# Patient Record
Sex: Female | Born: 1955 | Race: White | Hispanic: No | Marital: Married | Smoking: Former smoker
Health system: Southern US, Community
[De-identification: ages and names within clinical notes are randomized; demographics above are authoritative.]

## PROBLEM LIST (undated history)

## (undated) DIAGNOSIS — Z789 Other specified health status: Secondary | ICD-10-CM

## (undated) HISTORY — PX: BREAST SURGERY: SHX581

## (undated) HISTORY — PX: APPENDECTOMY: SHX54

## (undated) HISTORY — PX: TUBAL LIGATION: SHX77

## (undated) HISTORY — PX: DILATION AND CURETTAGE OF UTERUS: SHX78

---

## 2000-02-24 ENCOUNTER — Other Ambulatory Visit: Admission: RE | Admit: 2000-02-24 | Discharge: 2000-02-24 | Payer: Self-pay | Admitting: Obstetrics & Gynecology

## 2000-09-18 ENCOUNTER — Emergency Department (HOSPITAL_COMMUNITY): Admission: EM | Admit: 2000-09-18 | Discharge: 2000-09-18 | Payer: Self-pay | Admitting: Emergency Medicine

## 2001-09-07 ENCOUNTER — Other Ambulatory Visit: Admission: RE | Admit: 2001-09-07 | Discharge: 2001-09-07 | Payer: Self-pay | Admitting: Obstetrics & Gynecology

## 2002-11-27 ENCOUNTER — Other Ambulatory Visit: Admission: RE | Admit: 2002-11-27 | Discharge: 2002-11-27 | Payer: Self-pay | Admitting: Obstetrics & Gynecology

## 2003-04-05 ENCOUNTER — Other Ambulatory Visit: Admission: RE | Admit: 2003-04-05 | Discharge: 2003-04-05 | Payer: Self-pay | Admitting: Obstetrics & Gynecology

## 2004-05-19 ENCOUNTER — Other Ambulatory Visit: Admission: RE | Admit: 2004-05-19 | Discharge: 2004-05-19 | Payer: Self-pay | Admitting: Obstetrics & Gynecology

## 2004-08-11 ENCOUNTER — Other Ambulatory Visit: Admission: RE | Admit: 2004-08-11 | Discharge: 2004-08-11 | Payer: Self-pay | Admitting: Obstetrics & Gynecology

## 2005-03-04 ENCOUNTER — Other Ambulatory Visit: Admission: RE | Admit: 2005-03-04 | Discharge: 2005-03-04 | Payer: Self-pay | Admitting: Obstetrics & Gynecology

## 2013-01-11 ENCOUNTER — Encounter (INDEPENDENT_AMBULATORY_CARE_PROVIDER_SITE_OTHER): Payer: BC Managed Care – PPO | Admitting: Ophthalmology

## 2013-01-11 DIAGNOSIS — H2510 Age-related nuclear cataract, unspecified eye: Secondary | ICD-10-CM

## 2013-01-11 DIAGNOSIS — H43819 Vitreous degeneration, unspecified eye: Secondary | ICD-10-CM

## 2013-01-11 DIAGNOSIS — H35379 Puckering of macula, unspecified eye: Secondary | ICD-10-CM

## 2013-01-11 DIAGNOSIS — H3530 Unspecified macular degeneration: Secondary | ICD-10-CM

## 2013-01-15 ENCOUNTER — Encounter (INDEPENDENT_AMBULATORY_CARE_PROVIDER_SITE_OTHER): Payer: Self-pay | Admitting: Ophthalmology

## 2013-03-27 ENCOUNTER — Encounter (HOSPITAL_COMMUNITY): Payer: Self-pay | Admitting: Pharmacy Technician

## 2013-04-03 ENCOUNTER — Encounter (INDEPENDENT_AMBULATORY_CARE_PROVIDER_SITE_OTHER): Payer: BC Managed Care – PPO | Admitting: Ophthalmology

## 2013-04-06 ENCOUNTER — Encounter (HOSPITAL_COMMUNITY): Payer: Self-pay | Admitting: *Deleted

## 2013-04-10 ENCOUNTER — Encounter (HOSPITAL_COMMUNITY): Payer: BC Managed Care – PPO | Admitting: Certified Registered Nurse Anesthetist

## 2013-04-10 ENCOUNTER — Ambulatory Visit (HOSPITAL_COMMUNITY)
Admission: RE | Admit: 2013-04-10 | Discharge: 2013-04-11 | Disposition: A | Discharge status: Home / Self Care | Payer: BC Managed Care – PPO | Source: Ambulatory Visit | Attending: Ophthalmology | Admitting: Ophthalmology

## 2013-04-10 ENCOUNTER — Encounter (HOSPITAL_COMMUNITY)
Admission: RE | Disposition: A | Discharge status: Home / Self Care | Payer: Self-pay | Source: Ambulatory Visit | Attending: Ophthalmology

## 2013-04-10 ENCOUNTER — Ambulatory Visit (HOSPITAL_COMMUNITY): Payer: BC Managed Care – PPO

## 2013-04-10 ENCOUNTER — Ambulatory Visit (HOSPITAL_COMMUNITY): Payer: BC Managed Care – PPO | Admitting: Certified Registered Nurse Anesthetist

## 2013-04-10 ENCOUNTER — Encounter (INDEPENDENT_AMBULATORY_CARE_PROVIDER_SITE_OTHER): Payer: BC Managed Care – PPO | Admitting: Ophthalmology

## 2013-04-10 ENCOUNTER — Encounter (HOSPITAL_COMMUNITY): Payer: Self-pay | Admitting: *Deleted

## 2013-04-10 DIAGNOSIS — H2510 Age-related nuclear cataract, unspecified eye: Secondary | ICD-10-CM

## 2013-04-10 DIAGNOSIS — H35379 Puckering of macula, unspecified eye: Secondary | ICD-10-CM

## 2013-04-10 DIAGNOSIS — H43819 Vitreous degeneration, unspecified eye: Secondary | ICD-10-CM

## 2013-04-10 DIAGNOSIS — H35371 Puckering of macula, right eye: Secondary | ICD-10-CM | POA: Diagnosis present

## 2013-04-10 DIAGNOSIS — H3530 Unspecified macular degeneration: Secondary | ICD-10-CM

## 2013-04-10 DIAGNOSIS — Z87891 Personal history of nicotine dependence: Secondary | ICD-10-CM | POA: Insufficient documentation

## 2013-04-10 DIAGNOSIS — D381 Neoplasm of uncertain behavior of trachea, bronchus and lung: Secondary | ICD-10-CM

## 2013-04-10 HISTORY — PX: PARS PLANA VITRECTOMY: SHX2166

## 2013-04-10 HISTORY — DX: Other specified health status: Z78.9

## 2013-04-10 HISTORY — PX: MEMBRANE PEEL: SHX5967

## 2013-04-10 HISTORY — PX: LASER PHOTO ABLATION: SHX5942

## 2013-04-10 LAB — COMPREHENSIVE METABOLIC PANEL
ALT: 20 U/L (ref 0–35)
AST: 22 U/L (ref 0–37)
Albumin: 4.1 g/dL (ref 3.5–5.2)
Alkaline Phosphatase: 70 U/L (ref 39–117)
BILIRUBIN TOTAL: 0.6 mg/dL (ref 0.3–1.2)
BUN: 12 mg/dL (ref 6–23)
CALCIUM: 9.4 mg/dL (ref 8.4–10.5)
CO2: 25 mEq/L (ref 19–32)
Chloride: 102 mEq/L (ref 96–112)
Creatinine, Ser: 0.89 mg/dL (ref 0.50–1.10)
GFR calc Af Amer: 81 mL/min — ABNORMAL LOW (ref >90)
GFR calc non Af Amer: 70 mL/min — ABNORMAL LOW (ref >90)
Glucose, Bld: 104 mg/dL — ABNORMAL HIGH (ref 70–99)
Potassium: 4 mEq/L (ref 3.7–5.3)
Sodium: 142 mEq/L (ref 137–147)
Total Protein: 7.1 g/dL (ref 6.0–8.3)

## 2013-04-10 LAB — CBC
HEMATOCRIT: 43.3 % (ref 36.0–46.0)
Hemoglobin: 15 g/dL (ref 12.0–15.0)
MCH: 32.6 pg (ref 26.0–34.0)
MCHC: 34.6 g/dL (ref 30.0–36.0)
MCV: 94.1 fL (ref 78.0–100.0)
Platelets: 238 10*3/uL (ref 150–400)
RBC: 4.6 MIL/uL (ref 3.87–5.11)
RDW: 12.3 % (ref 11.5–15.5)
WBC: 8.3 10*3/uL (ref 4.0–10.5)

## 2013-04-10 LAB — PROTIME-INR
INR: 1.02 (ref 0.00–1.49)
PROTHROMBIN TIME: 13.2 s (ref 11.6–15.2)

## 2013-04-10 SURGERY — PARS PLANA VITRECTOMY WITH 25 GAUGE
Anesthesia: General | Site: Eye | Laterality: Right

## 2013-04-10 MED ORDER — EPINEPHRINE HCL 1 MG/ML IJ SOLN
INTRAMUSCULAR | Status: AC
  Filled 2013-04-10: qty 1

## 2013-04-10 MED ORDER — CYCLOPENTOLATE HCL 1 % OP SOLN
1.0000 [drp] | OPHTHALMIC | Status: AC | PRN
  Administered 2013-04-10 (×3): 1 [drp] via OPHTHALMIC
  Filled 2013-04-10: qty 2

## 2013-04-10 MED ORDER — DEXAMETHASONE SODIUM PHOSPHATE 10 MG/ML IJ SOLN
INTRAMUSCULAR | Status: DC | PRN
  Administered 2013-04-10: 10 mg

## 2013-04-10 MED ORDER — PROPOFOL 10 MG/ML IV BOLUS
INTRAVENOUS | Status: AC
  Filled 2013-04-10: qty 20

## 2013-04-10 MED ORDER — DEXAMETHASONE SODIUM PHOSPHATE 10 MG/ML IJ SOLN
INTRAMUSCULAR | Status: DC | PRN
  Administered 2013-04-10: 8 mg via INTRAVENOUS

## 2013-04-10 MED ORDER — GLYCOPYRROLATE 0.2 MG/ML IJ SOLN
INTRAMUSCULAR | Status: DC | PRN
  Administered 2013-04-10: .8 mg via INTRAVENOUS

## 2013-04-10 MED ORDER — HYDROCODONE-ACETAMINOPHEN 5-325 MG PO TABS
1.0000 | ORAL_TABLET | ORAL | Status: DC | PRN
Start: 2013-04-10 — End: 2013-04-11
  Administered 2013-04-10 – 2013-04-11 (×2): 2 via ORAL
  Filled 2013-04-10 (×2): qty 2

## 2013-04-10 MED ORDER — PHENYLEPHRINE HCL 10 MG/ML IJ SOLN
INTRAMUSCULAR | Status: DC | PRN
  Administered 2013-04-10: 80 ug via INTRAVENOUS
  Administered 2013-04-10: 40 ug via INTRAVENOUS
  Administered 2013-04-10: 80 ug via INTRAVENOUS
  Administered 2013-04-10: 120 ug via INTRAVENOUS

## 2013-04-10 MED ORDER — SODIUM HYALURONATE 10 MG/ML IO SOLN
INTRAOCULAR | Status: DC | PRN
  Administered 2013-04-10: 0.85 mL via INTRAOCULAR

## 2013-04-10 MED ORDER — MIDAZOLAM HCL 5 MG/5ML IJ SOLN
INTRAMUSCULAR | Status: DC | PRN
Start: 2013-04-10 — End: 2013-04-10
  Administered 2013-04-10: 1 mg via INTRAVENOUS

## 2013-04-10 MED ORDER — TRIAMCINOLONE ACETONIDE 40 MG/ML IJ SUSP
INTRAMUSCULAR | Status: AC
  Filled 2013-04-10: qty 5

## 2013-04-10 MED ORDER — BRIMONIDINE TARTRATE 0.2 % OP SOLN
1.0000 [drp] | Freq: Two times a day (BID) | OPHTHALMIC | Status: DC
  Filled 2013-04-10: qty 5

## 2013-04-10 MED ORDER — SODIUM CHLORIDE 0.9 % IV SOLN
INTRAVENOUS | Status: DC | PRN
  Administered 2013-04-10: 12:00:00 via INTRAVENOUS

## 2013-04-10 MED ORDER — LIDOCAINE HCL (CARDIAC) 20 MG/ML IV SOLN
INTRAVENOUS | Status: AC
  Filled 2013-04-10: qty 5

## 2013-04-10 MED ORDER — GATIFLOXACIN 0.5 % OP SOLN
1.0000 [drp] | OPHTHALMIC | Status: AC | PRN
  Administered 2013-04-10 (×3): 1 [drp] via OPHTHALMIC
  Filled 2013-04-10: qty 2.5

## 2013-04-10 MED ORDER — ATROPINE SULFATE 1 % OP SOLN
OPHTHALMIC | Status: AC
  Filled 2013-04-10: qty 2

## 2013-04-10 MED ORDER — PROMETHAZINE HCL 25 MG/ML IJ SOLN: 6.2500 mg | INTRAMUSCULAR | Status: DC | PRN

## 2013-04-10 MED ORDER — PREDNISOLONE ACETATE 1 % OP SUSP
1.0000 [drp] | Freq: Four times a day (QID) | OPHTHALMIC | Status: DC
  Filled 2013-04-10: qty 1

## 2013-04-10 MED ORDER — BACITRACIN-POLYMYXIN B 500-10000 UNIT/GM OP OINT
TOPICAL_OINTMENT | OPHTHALMIC | Status: DC | PRN
  Administered 2013-04-10: 1 via OPHTHALMIC

## 2013-04-10 MED ORDER — GATIFLOXACIN 0.5 % OP SOLN
1.0000 [drp] | Freq: Four times a day (QID) | OPHTHALMIC | Status: DC
  Filled 2013-04-10: qty 2.5

## 2013-04-10 MED ORDER — SODIUM CHLORIDE 0.45 % IV SOLN
INTRAVENOUS | Status: DC
  Administered 2013-04-10: 17:00:00 via INTRAVENOUS

## 2013-04-10 MED ORDER — BSS IO SOLN
INTRAOCULAR | Status: DC | PRN
  Administered 2013-04-10: 15 mL via INTRAOCULAR

## 2013-04-10 MED ORDER — HYDROMORPHONE HCL PF 1 MG/ML IJ SOLN
0.2500 mg | INTRAMUSCULAR | Status: DC | PRN
  Administered 2013-04-10 (×2): 0.5 mg via INTRAVENOUS

## 2013-04-10 MED ORDER — NEOSTIGMINE METHYLSULFATE 1 MG/ML IJ SOLN
INTRAMUSCULAR | Status: DC | PRN
  Administered 2013-04-10: 4 mg via INTRAVENOUS

## 2013-04-10 MED ORDER — ACETAZOLAMIDE SODIUM 500 MG IJ SOLR
500.0000 mg | Freq: Once | INTRAMUSCULAR | Status: AC
  Administered 2013-04-11: 500 mg via INTRAVENOUS
  Filled 2013-04-10: qty 500

## 2013-04-10 MED ORDER — LIDOCAINE HCL (CARDIAC) 20 MG/ML IV SOLN
INTRAVENOUS | Status: DC | PRN
  Administered 2013-04-10: 80 mg via INTRAVENOUS

## 2013-04-10 MED ORDER — ONDANSETRON HCL 4 MG/2ML IJ SOLN
4.0000 mg | Freq: Four times a day (QID) | INTRAMUSCULAR | Status: DC | PRN
Start: 2013-04-10 — End: 2013-04-11

## 2013-04-10 MED ORDER — MIDAZOLAM HCL 2 MG/2ML IJ SOLN
INTRAMUSCULAR | Status: AC
  Filled 2013-04-10: qty 2

## 2013-04-10 MED ORDER — BACITRACIN-POLYMYXIN B 500-10000 UNIT/GM OP OINT
TOPICAL_OINTMENT | OPHTHALMIC | Status: AC
  Filled 2013-04-10: qty 3.5

## 2013-04-10 MED ORDER — PROPOFOL 10 MG/ML IV BOLUS
INTRAVENOUS | Status: DC | PRN
  Administered 2013-04-10: 150 mg via INTRAVENOUS

## 2013-04-10 MED ORDER — GLYCOPYRROLATE 0.2 MG/ML IJ SOLN
INTRAMUSCULAR | Status: AC
  Filled 2013-04-10: qty 4

## 2013-04-10 MED ORDER — SODIUM HYALURONATE 10 MG/ML IO SOLN
INTRAOCULAR | Status: AC
  Filled 2013-04-10: qty 0.85

## 2013-04-10 MED ORDER — POLYMYXIN B SULFATE 500000 UNITS IJ SOLR
INTRAMUSCULAR | Status: AC
  Filled 2013-04-10: qty 1

## 2013-04-10 MED ORDER — ROCURONIUM BROMIDE 50 MG/5ML IV SOLN
INTRAVENOUS | Status: AC
  Filled 2013-04-10: qty 1

## 2013-04-10 MED ORDER — BUPIVACAINE HCL (PF) 0.75 % IJ SOLN
INTRAMUSCULAR | Status: AC
  Filled 2013-04-10: qty 10

## 2013-04-10 MED ORDER — FENTANYL CITRATE 0.05 MG/ML IJ SOLN
INTRAMUSCULAR | Status: DC | PRN
  Administered 2013-04-10: 75 ug via INTRAVENOUS

## 2013-04-10 MED ORDER — ACETAMINOPHEN 325 MG PO TABS: 325.0000 mg | ORAL_TABLET | ORAL | Status: DC | PRN

## 2013-04-10 MED ORDER — TETRACAINE HCL 0.5 % OP SOLN
2.0000 [drp] | Freq: Once | OPHTHALMIC | Status: DC
  Filled 2013-04-10: qty 2

## 2013-04-10 MED ORDER — HYDROMORPHONE HCL PF 1 MG/ML IJ SOLN
INTRAMUSCULAR | Status: AC
  Filled 2013-04-10: qty 1

## 2013-04-10 MED ORDER — DEXAMETHASONE SODIUM PHOSPHATE 10 MG/ML IJ SOLN
INTRAMUSCULAR | Status: AC
  Filled 2013-04-10: qty 1

## 2013-04-10 MED ORDER — ONDANSETRON HCL 4 MG/2ML IJ SOLN
INTRAMUSCULAR | Status: DC | PRN
  Administered 2013-04-10: 4 mg via INTRAVENOUS

## 2013-04-10 MED ORDER — BSS PLUS IO SOLN
INTRAOCULAR | Status: AC
  Filled 2013-04-10: qty 500

## 2013-04-10 MED ORDER — ONDANSETRON HCL 4 MG/2ML IJ SOLN
INTRAMUSCULAR | Status: AC
  Filled 2013-04-10: qty 2

## 2013-04-10 MED ORDER — 0.9 % SODIUM CHLORIDE (POUR BTL) OPTIME
TOPICAL | Status: DC | PRN
  Administered 2013-04-10: 1000 mL

## 2013-04-10 MED ORDER — BACITRACIN-POLYMYXIN B 500-10000 UNIT/GM OP OINT
1.0000 "application " | TOPICAL_OINTMENT | Freq: Four times a day (QID) | OPHTHALMIC | Status: DC
  Filled 2013-04-10: qty 3.5

## 2013-04-10 MED ORDER — LATANOPROST 0.005 % OP SOLN
1.0000 [drp] | Freq: Every day | OPHTHALMIC | Status: DC
  Filled 2013-04-10: qty 2.5

## 2013-04-10 MED ORDER — PHENYLEPHRINE HCL 2.5 % OP SOLN
1.0000 [drp] | OPHTHALMIC | Status: AC | PRN
  Administered 2013-04-10 (×3): 1 [drp] via OPHTHALMIC
  Filled 2013-04-10: qty 15

## 2013-04-10 MED ORDER — OXYCODONE HCL 5 MG/5ML PO SOLN: 5.0000 mg | Freq: Once | ORAL | Status: DC | PRN

## 2013-04-10 MED ORDER — TEMAZEPAM 15 MG PO CAPS
15.0000 mg | ORAL_CAPSULE | Freq: Every evening | ORAL | Status: DC | PRN
  Administered 2013-04-10: 15 mg via ORAL
  Filled 2013-04-10: qty 1

## 2013-04-10 MED ORDER — BUPIVACAINE HCL (PF) 0.75 % IJ SOLN
INTRAMUSCULAR | Status: DC | PRN
  Administered 2013-04-10: 10 mL

## 2013-04-10 MED ORDER — GENTAMICIN SULFATE 40 MG/ML IJ SOLN
INTRAMUSCULAR | Status: AC
  Filled 2013-04-10: qty 2

## 2013-04-10 MED ORDER — ROCURONIUM BROMIDE 100 MG/10ML IV SOLN
INTRAVENOUS | Status: DC | PRN
  Administered 2013-04-10: 30 mg via INTRAVENOUS

## 2013-04-10 MED ORDER — SODIUM CHLORIDE 0.9 % IJ SOLN
INTRAMUSCULAR | Status: DC | PRN
  Administered 2013-04-10: 11:00:00

## 2013-04-10 MED ORDER — MORPHINE SULFATE 2 MG/ML IJ SOLN
1.0000 mg | INTRAMUSCULAR | Status: AC | PRN
  Administered 2013-04-10 (×2): 2 mg via INTRAVENOUS
  Filled 2013-04-10: qty 2

## 2013-04-10 MED ORDER — TROPICAMIDE 1 % OP SOLN
1.0000 [drp] | OPHTHALMIC | Status: AC | PRN
  Administered 2013-04-10 (×3): 1 [drp] via OPHTHALMIC
  Filled 2013-04-10: qty 3

## 2013-04-10 MED ORDER — NEOSTIGMINE METHYLSULFATE 1 MG/ML IJ SOLN
INTRAMUSCULAR | Status: AC
  Filled 2013-04-10: qty 10

## 2013-04-10 MED ORDER — OXYCODONE HCL 5 MG PO TABS: 5.0000 mg | ORAL_TABLET | Freq: Once | ORAL | Status: DC | PRN

## 2013-04-10 MED ORDER — DOCUSATE SODIUM 100 MG PO CAPS
100.0000 mg | ORAL_CAPSULE | Freq: Two times a day (BID) | ORAL | Status: DC
  Administered 2013-04-10: 100 mg via ORAL

## 2013-04-10 MED ORDER — EPINEPHRINE HCL 1 MG/ML IJ SOLN
INTRAMUSCULAR | Status: DC | PRN
  Administered 2013-04-10: 11:00:00

## 2013-04-10 MED ORDER — FENTANYL CITRATE 0.05 MG/ML IJ SOLN
INTRAMUSCULAR | Status: AC
  Filled 2013-04-10: qty 5

## 2013-04-10 MED ORDER — SODIUM CHLORIDE 0.9 % IJ SOLN
INTRAMUSCULAR | Status: AC
  Filled 2013-04-10: qty 10

## 2013-04-10 MED ORDER — MAGNESIUM HYDROXIDE 400 MG/5ML PO SUSP: 15.0000 mL | Freq: Four times a day (QID) | ORAL | Status: DC | PRN

## 2013-04-10 SURGICAL SUPPLY — 59 items
BALL CTTN LRG ABS STRL LF (GAUZE/BANDAGES/DRESSINGS) ×3
BLADE MVR KNIFE 20G (BLADE) ×2 IMPLANT
CANNULA VLV SOFT TIP 25G (OPHTHALMIC) ×1 IMPLANT
CANNULA VLV SOFT TIP 25GA (OPHTHALMIC) ×3 IMPLANT
CORDS BIPOLAR (ELECTRODE) ×2 IMPLANT
COTTONBALL LRG STERILE PKG (GAUZE/BANDAGES/DRESSINGS) ×9 IMPLANT
COVER MAYO STAND STRL (DRAPES) ×3 IMPLANT
DRAPE INCISE 51X51 W/FILM STRL (DRAPES) ×3 IMPLANT
DRAPE OPHTHALMIC 77X100 STRL (CUSTOM PROCEDURE TRAY) ×5 IMPLANT
FORCEPS GRIESHABER ILM 25G A (INSTRUMENTS) ×2 IMPLANT
GLOVE SS BIOGEL STRL SZ 6.5 (GLOVE) ×1 IMPLANT
GLOVE SS BIOGEL STRL SZ 7 (GLOVE) ×1 IMPLANT
GLOVE SUPERSENSE BIOGEL SZ 6.5 (GLOVE) ×2
GLOVE SUPERSENSE BIOGEL SZ 7 (GLOVE) ×2
GLOVE SURG 8.5 LATEX PF (GLOVE) ×3 IMPLANT
GLOVE SURG SS PI 7.0 STRL IVOR (GLOVE) ×2 IMPLANT
GOWN STRL REUS W/ TWL LRG LVL3 (GOWN DISPOSABLE) ×3 IMPLANT
GOWN STRL REUS W/TWL LRG LVL3 (GOWN DISPOSABLE) ×9
HANDLE PNEUMATIC FOR CONSTEL (OPHTHALMIC) ×2 IMPLANT
KIT BASIN OR (CUSTOM PROCEDURE TRAY) ×3 IMPLANT
KNIFE CRESCENT 2.5 55 ANG (BLADE) ×2 IMPLANT
KNIFE GRIESHABER SHARP 2.5MM (MISCELLANEOUS) ×2 IMPLANT
MASK EYE SHIELD (GAUZE/BANDAGES/DRESSINGS) ×2 IMPLANT
MICROPICK .5MM ×2 IMPLANT
MICROPICK 25G (MISCELLANEOUS) ×2
NDL 18GX1X1/2 (RX/OR ONLY) (NEEDLE) ×1 IMPLANT
NDL 25GX 5/8IN NON SAFETY (NEEDLE) ×1 IMPLANT
NDL FILTER BLUNT 18X1 1/2 (NEEDLE) ×1 IMPLANT
NDL HYPO 30X.5 LL (NEEDLE) ×1 IMPLANT
NEEDLE 18GX1X1/2 (RX/OR ONLY) (NEEDLE) ×3 IMPLANT
NEEDLE 25GX 5/8IN NON SAFETY (NEEDLE) ×3 IMPLANT
NEEDLE FILTER BLUNT 18X 1/2SAF (NEEDLE) ×2
NEEDLE FILTER BLUNT 18X1 1/2 (NEEDLE) ×1 IMPLANT
NEEDLE HYPO 30X.5 LL (NEEDLE) ×3 IMPLANT
NS IRRIG 1000ML POUR BTL (IV SOLUTION) ×3 IMPLANT
PACK VITRECTOMY CUSTOM (CUSTOM PROCEDURE TRAY) ×3 IMPLANT
PAD ARMBOARD 7.5X6 YLW CONV (MISCELLANEOUS) ×6 IMPLANT
PAD EYE OVAL STERILE LF (GAUZE/BANDAGES/DRESSINGS) ×2 IMPLANT
PAK PIK VITRECTOMY CVS 25GA (OPHTHALMIC) ×3 IMPLANT
PIC ILLUMINATED 25G (OPHTHALMIC) ×3
PICK MICROPICK 25G (MISCELLANEOUS) IMPLANT
PIK ILLUMINATED 25G (OPHTHALMIC) ×1 IMPLANT
PROBE LASER ILLUM FLEX CVD 25G (OPHTHALMIC) IMPLANT
REPL STRA BRUSH NDL (NEEDLE) IMPLANT
REPL STRA BRUSH NEEDLE (NEEDLE) ×3 IMPLANT
RESERVOIR BACK FLUSH (MISCELLANEOUS) ×2 IMPLANT
ROLLS DENTAL (MISCELLANEOUS) ×6 IMPLANT
SCISSORS TIP ADVANCED DSP 25GA (INSTRUMENTS) IMPLANT
SCRAPER DIAMOND DUST MEMBRANE (MISCELLANEOUS) ×2 IMPLANT
SPONGE SURGIFOAM ABS GEL 12-7 (HEMOSTASIS) ×3 IMPLANT
SYR 20CC LL (SYRINGE) ×3 IMPLANT
SYR 5ML LL (SYRINGE) IMPLANT
SYR BULB 3OZ (MISCELLANEOUS) ×3 IMPLANT
SYR TB 1ML LUER SLIP (SYRINGE) ×3 IMPLANT
TAPE SURG TRANSPORE 1 IN (GAUZE/BANDAGES/DRESSINGS) IMPLANT
TAPE SURGICAL TRANSPORE 1 IN (GAUZE/BANDAGES/DRESSINGS) ×2
TOWEL OR 17X24 6PK STRL BLUE (TOWEL DISPOSABLE) ×9 IMPLANT
WATER STERILE IRR 1000ML POUR (IV SOLUTION) ×3 IMPLANT
WIPE INSTRUMENT VISIWIPE 73X73 (MISCELLANEOUS) ×3 IMPLANT

## 2013-04-10 NOTE — Transfer of Care (Signed)
Immediate Anesthesia Transfer of Care Note  Patient: Virginia Carr  Procedure(s) Performed: Procedure(s): PARS PLANA VITRECTOMY WITH 25 GAUGE (Right) MEMBRANE PEEL (Right) LASER PHOTO ABLATION (Right) AIR/FLUID EXCHANGE (Right)  Patient Location: PACU  Anesthesia Type:General  Level of Consciousness: awake and alert   Airway & Oxygen Therapy: Patient Spontanous Breathing and Patient connected to nasal cannula oxygen  Post-op Assessment: Report given to PACU RN and Post -op Vital signs reviewed and stable  Post vital signs: Reviewed and stable  Complications: No apparent anesthesia complications

## 2013-04-10 NOTE — Anesthesia Postprocedure Evaluation (Signed)
  Anesthesia Post-op Note  Patient: Virginia Carr  Procedure(s) Performed: Procedure(s): PARS PLANA VITRECTOMY WITH 25 GAUGE (Right) MEMBRANE PEEL (Right) LASER PHOTO ABLATION (Right) AIR/FLUID EXCHANGE (Right)  Patient Location: PACU  Anesthesia Type:General  Level of Consciousness: awake, alert  and oriented  Airway and Oxygen Therapy: Patient Spontanous Breathing  Post-op Pain: mild  Post-op Assessment: Post-op Vital signs reviewed, Patient's Cardiovascular Status Stable, Respiratory Function Stable, Patent Airway, No signs of Nausea or vomiting and Pain level controlled  Post-op Vital Signs: Reviewed and stable  Complications: No apparent anesthesia complications

## 2013-04-10 NOTE — Anesthesia Preprocedure Evaluation (Signed)
Anesthesia Evaluation  Patient identified by MRN, date of birth, ID band Patient awake    Reviewed: Allergy & Precautions, H&P , NPO status , reviewed documented beta blocker date and time   Airway Mallampati: I  Neck ROM: Full    Dental  (+) Teeth Intact   Pulmonary former smoker,  breath sounds clear to auscultation        Cardiovascular negative cardio ROS  Rhythm:Regular Rate:Normal     Neuro/Psych    GI/Hepatic negative GI ROS, Neg liver ROS,   Endo/Other  negative endocrine ROS  Renal/GU negative Renal ROS     Musculoskeletal   Abdominal (+) + obese,   Peds  Hematology negative hematology ROS (+)   Anesthesia Other Findings   Reproductive/Obstetrics                           Anesthesia Physical Anesthesia Plan  ASA: I  Anesthesia Plan: General   Post-op Pain Management:    Induction: Intravenous  Airway Management Planned: Oral ETT  Additional Equipment:   Intra-op Plan:   Post-operative Plan: Extubation in OR  Informed Consent: I have reviewed the patients History and Physical, chart, labs and discussed the procedure including the risks, benefits and alternatives for the proposed anesthesia with the patient or authorized representative who has indicated his/her understanding and acceptance.   Dental advisory given  Plan Discussed with: CRNA and Surgeon  Anesthesia Plan Comments:         Anesthesia Quick Evaluation

## 2013-04-10 NOTE — Anesthesia Procedure Notes (Signed)
Procedure Name: Intubation Date/Time: 04/10/2013 11:40 AM Performed by: Ollen Bowl Pre-anesthesia Checklist: Patient identified, Timeout performed, Emergency Drugs available, Suction available and Patient being monitored Patient Re-evaluated:Patient Re-evaluated prior to inductionOxygen Delivery Method: Circle system utilized and Simple face mask Preoxygenation: Pre-oxygenation with 100% oxygen Intubation Type: IV induction Ventilation: Mask ventilation without difficulty Laryngoscope Size: Miller and 2 Grade View: Grade I Tube type: Oral Tube size: 7.0 mm Number of attempts: 1 Airway Equipment and Method: Patient positioned with wedge pillow and Stylet Placement Confirmation: ETT inserted through vocal cords under direct vision,  positive ETCO2 and breath sounds checked- equal and bilateral Secured at: 22 cm Tube secured with: Tape Dental Injury: Teeth and Oropharynx as per pre-operative assessment

## 2013-04-10 NOTE — Anesthesia Postprocedure Evaluation (Signed)
  Anesthesia Post-op Note  Patient: Virginia Carr  Procedure(s) Performed: Procedure(s): PARS PLANA VITRECTOMY WITH 25 GAUGE (Right) MEMBRANE PEEL (Right) LASER PHOTO ABLATION (Right) AIR/FLUID EXCHANGE (Right)  Patient Location: PACU  Anesthesia Type:General  Level of Consciousness: awake and alert   Airway and Oxygen Therapy: Patient Spontanous Breathing  Post-op Pain: mild  Post-op Assessment: Post-op Vital signs reviewed  Post-op Vital Signs: stable  Complications: No apparent anesthesia complications

## 2013-04-10 NOTE — Brief Op Note (Signed)
Brief Operative note   Preoperative diagnosis:  Pre retinal fibrosis right eye Postoperative diagnosis  Post-Op Diagnosis Codes:    * Macular puckering of retina, right [362.56]  Procedures: Pars plana vitrectomy, laser, membrane peel, gas injection, right eye.  Surgeon:  Hayden Pedro, MD...  Assistant:  Deatra Ina SA   Anesthesia: General  Specimen: none  Estimated blood loss:  1cc  Complications: none  Patient sent to PACU in good condition  Composed by Hayden Pedro MD  Dictation number: [id number]

## 2013-04-10 NOTE — Consult Note (Addendum)
[address]       [place] [address]             [phone]          Everett P Cutler Record [id number] Date of Birth: February 03, 1955  Referring: Tempie Hoist, MD Primary Care: Dr. Carlos Levering [facility], [place])   Reason for Consultation: Incidental  left lung nodule on Preop Chest Xray   History of Present Illness:     58 year old female from [place], [place] with no significant past medical history.  She presented today to [facility] for a planned eye surgery by Dr. Tempie Hoist.  A pre-op chest x-ray showed an incidental 7 mm nodular opacity of the left lung. Patient has no recent chest X-rays or CT of the chest for comparision. TCTS was asked to see the patient in consult.  She reports a recent upper respiratory infection in the last week with productive cough of whitish sputum and low grade fever, which resolved without treatment.  She denies shortness of breath, hemoptysis, dyspnea on exertion, wheezing, weight loss or chest discomfort. She has traveled a lot since the holidays, visiting [place], [place] in the past 3 months.  She is a previous smoker, having smoked 3 packs of cigarettes per day between ages [age], but none since quitting.  No family history of lung cancer, COPD or emphysema.    Past Medical History: Past Medical History  Diagnosis Date  . Medical history non-contributory   Pre-retinal fibrosis, right eye   Past Surgical History: Past Surgical History  Procedure Laterality Date  . Appendectomy    . Breast surgery      Hx: of augmentation  . Tubal ligation    . Dilation and curettage of uterus    Pars plana vitrectomy, right eye-04/10/2013 by Dr. Zigmund Daniel   Social History: History   Social History  . Marital Status: Married    Spouse Name: N/A    Number of Children: N/A  . Years of Education: N/A   Occupational History  . 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 Smoking status: Former Smoker - 3ppd    Types: Cigarettes  . Smokeless tobacco: Not on file     Comment: quit smoking cigarretes 6 (age [age])  . Alcohol Use: 8.4 oz/week    14 Glasses of wine per week     Comment: daily  . Drug Use: No  . Sexual Activity: Not on file     Allergies: No Known Allergies   Medications: Current Facility-Administered Medications  Medication Dose Route Frequency Provider Last Rate Last Dose  . HYDROmorphone (DILAUDID) 1 MG/ML injection           . HYDROmorphone (DILAUDID) injection 0.25-0.5 mg  0.25-0.5 mg Intravenous Q5 min PRN Rica Koyanagi, MD   0.5 mg at 04/10/13 1304  . oxyCODONE (Oxy IR/ROXICODONE) immediate release tablet 5 mg  5 mg Oral Once PRN Rica Koyanagi, MD       Or  . oxyCODONE (ROXICODONE) 5 MG/5ML solution 5 mg  5 mg Oral Once PRN Rica Koyanagi, MD      . promethazine (PHENERGAN) injection 6.25-12.5 mg  6.25-12.5 mg Intravenous Q15 min PRN Rica Koyanagi, MD        Prescriptions prior to admission  Medication Sig Dispense Refill  . Multiple Vitamins-Minerals (PRESERVISION/LUTEIN PO) Take 1  capsule by mouth daily.        Family History: Family History    Problem Relation Age of Onset    . Diabetes Mother -deceased     . Leukemia Sister -deceased     . Cancer- testicular Brother-deceased     . Heart disease Stroke Other Father -deceased        Review of Systems:     Cardiac Review of Systems: Y or N  Chest Pain [n    ]  Resting SOB [  n ] Exertional SOB  [ n ]  Orthopnea [n  ]   Pedal Edema [  n ]    Palpitations [ n ] Syncope  [ n ]   Presyncope [  n ]  General Review of Systems: [Y] = yes [  ]=no Constitional: recent weight change [ n ]; anorexia [  ]; fatigue [  ]; nausea [  ]; night sweats [  ]; fever [ n ]; or chills [ n ];                                                                                                                                            Dental: poor dentition[  ]; Last Dentist visit:  Eye : blurred vision [ x ]; diplopia [   ]; vision changes [ x ];  Amaurosis fugax[  ]; Resp: cough [ x- recent URI ];  wheezing[  ];  hemoptysis[  n]; shortness of breath[  n]; paroxysmal nocturnal dyspnea[n  ]; dyspnea on exertion[n  ]; or orthopnea[ n ];  GI:  gallstones[  ], vomiting[  ];  dysphagia[  ]; melena[  ];  hematochezia [  ]; heartburn[  ];   Hx of  Colonoscopy[  ]; GU: kidney stones [  ]; hematuria[  ];   dysuria [  ];  nocturia[  ];  history of     obstruction [  ];             Skin: rash, swelling[  ];, hair loss[  ];  peripheral edema[  ];  or itching[  ]; Musculosketetal: myalgias[  ];  joint swelling[  ];  joint erythema[  ];  joint pain[  ];  back pain[  ];  Heme/Lymph: bruising[  ];  bleeding[  ];  anemia[  ];  Neuro: TIA[  ];  headaches[  ];  stroke[  ];  vertigo[  ];  seizures[  ];   paresthesias[  ];  difficulty walking[ n ];  Psych:depression[  ]; anxiety[  ];  Endocrine: diabetes[?  ];  thyroid dysfunction[ ? ];    Physical Exam: BP 125/71  Pulse 69  Temp(Src) 97.7 F (36.5 C) (Oral)  Resp 15  SpO2 99%  General appearance: alert, cooperative and no distress Neurologic: intact Heart: regular rate and rhythm Lungs: clear to auscultation bilaterally  Abdomen: soft, non-tender; bowel sounds normal; no masses,  no organomegaly Extremities: extremities normal, atraumatic, no cyanosis or edema Bandage on rt eye, no cervical or axillary adenopathy   Diagnostic Studies & Laboratory data:     Recent Radiology Findings:   X-ray Chest Pa And Lateral  04/10/2013   CLINICAL DATA:  Preoperative retina surgery  EXAM: CHEST  2 VIEW  COMPARISON:  None.  FINDINGS: On the lateral view slightly superior to the heart in the anterior mediastinum, there is a 7 mm nodular opacity. Lungs elsewhere are clear. The heart size and pulmonary vascularity are normal. No adenopathy. No bone lesions.  IMPRESSION: 7 mm nodular opacity seen  anteriorly slightly superior to the heart on the lateral view only. This finding warrants a noncontrast enhanced chest CT to further evaluate. Lungs otherwise clear.   Electronically Signed   By: Lowella Grip M.D.   On: 04/10/2013 09:25      Recent Lab Findings: Lab Results  Component Value Date   WBC 8.3 04/10/2013   HGB 15.0 04/10/2013   HCT 43.3 04/10/2013   PLT 238 04/10/2013   GLUCOSE 104* 04/10/2013   ALT 20 04/10/2013   AST 22 04/10/2013   NA 142 04/10/2013   K 4.0 04/10/2013   CL 102 04/10/2013   CREATININE 0.89 04/10/2013   BUN 12 04/10/2013   CO2 25 04/10/2013   INR 1.02 04/10/2013      Assessment / Plan:   The patient is a 58 year old female with no significant past medical history who was found to have an incidental left lung nodule on pre-op chest x-ray prior to eye surgery.  She is a former smoker, smoking 3 ppd x nearly 10 years.  She also had a recent URI with cough, which has now resolved. She is seen today in the PACU following her eye surgery, and is being admitted for observation.  We will obtain a CT of the chest to further delineate this area and will  make further recommendations pending result of CT  Grace Isaac MD      [address] [place] [address] Office [phone]   Beeper [phone]   Addendum: After seen ct of chest was completed: X-ray Chest Pa And Lateral  04/10/2013   CLINICAL DATA:  Preoperative retina surgery  EXAM: CHEST  2 VIEW  COMPARISON:  None.  FINDINGS: On the lateral view slightly superior to the heart in the anterior mediastinum, there is a 7 mm nodular opacity. Lungs elsewhere are clear. The heart size and pulmonary vascularity are normal. No adenopathy. No bone lesions.  IMPRESSION: 7 mm nodular opacity seen anteriorly slightly superior to the heart on the lateral view only. This finding warrants a noncontrast enhanced chest CT to further evaluate. Lungs otherwise clear.   Electronically Signed   By: Lowella Grip M.D.   On: 04/10/2013 09:25   Ct Chest Wo Contrast  04/10/2013   CLINICAL DATA:  Possible pulmonary nodule by plain film of the chest.  EXAM: CT CHEST WITHOUT CONTRAST  TECHNIQUE: Multidetector CT imaging of the chest was performed following the standard protocol without IV contrast.  COMPARISON:  PA and lateral chest 04/10/2013.  FINDINGS: There is no axillary, hilar or mediastinal lymphadenopathy. No pleural or pericardial effusion. Bilateral breast implants are noted. Heart size is normal. There is no pulmonary nodule. Dependent bibasilar atelectasis is worse on the right. The lungs are otherwise clear. Imaged upper abdomen is unremarkable. No lytic or sclerotic  bony lesion is seen with thoracic spondylosis noted.  IMPRESSION: Negative for pulmonary nodule. No acute finding. Bibasilar atelectasis noted.   Electronically Signed   By: Inge Rise M.D.   On: 04/10/2013 21:39    No further Thoracic surgery Follow up needed.  Grace Isaac MD      [address] [place] [address] Office [phone]   Beeper [phone]

## 2013-04-10 NOTE — H&P (Signed)
I examined the patient today and there is no change in the medical status 

## 2013-04-10 NOTE — H&P (Signed)
Virginia Carr is an 58 y.o. female.   Chief Complaint:reduced vision right eye HPI: preretinal fibrosis right eye  Past Medical History  Diagnosis Date  . Medical history non-contributory     Past Surgical History  Procedure Laterality Date  . Appendectomy    . Breast surgery      Hx: of augmentation  . Tubal ligation    . Dilation and curettage of uterus      Family History  Problem Relation Age of Onset  . Diabetes Mother   . Leukemia Sister   . Cancer Brother   . Heart disease Other    Social History:  reports that she has quit smoking. Her smoking use included Cigarettes. She smoked 0.00 packs per day. She does not have any smokeless tobacco history on file. She reports that she drinks about 8.4 ounces of alcohol per week. She reports that she does not use illicit drugs.  Allergies: No Known Allergies  Medications Prior to Admission  Medication Sig Dispense Refill  . Multiple Vitamins-Minerals (PRESERVISION/LUTEIN PO) Take 1 capsule by mouth daily.        Review of systems otherwise negative  There were no vitals taken for this visit.  Physical exam: Mental status: oriented x3. Eyes: See eye exam associated with this date of surgery in media tab.  Scanned in by scanning center Ears, Nose, Throat: within normal limits Neck: Within Normal limits General: within normal limits Chest: Within normal limits Breast: deferred Heart: Within normal limits Abdomen: Within normal limits GU: deferred Extremities: within normal limits Skin: within normal limits  Assessment/Plan Preretinal fibrosis right eye Plan: To [facility] for Pars plana vitrectomy, laser, membrane peel, gas injection right eye  Kache Mcclurg, Chrystie Nose 04/10/2013, 8:59 AM

## 2013-04-10 NOTE — Progress Notes (Signed)
Spoke with Dr. Zigmund Daniel regarding CXR results.  He said that "we will take care of if after surgery".   DA

## 2013-04-11 LAB — URINALYSIS, ROUTINE W REFLEX MICROSCOPIC
BILIRUBIN URINE: NEGATIVE
Glucose, UA: NEGATIVE mg/dL
HGB URINE DIPSTICK: NEGATIVE
KETONES UR: NEGATIVE mg/dL
Leukocytes, UA: NEGATIVE
NITRITE: NEGATIVE
PH: 8 (ref 5.0–8.0)
Protein, ur: NEGATIVE mg/dL
Specific Gravity, Urine: 1.006 (ref 1.005–1.030)
Urobilinogen, UA: 0.2 mg/dL (ref 0.0–1.0)

## 2013-04-11 MED ORDER — HYDROCODONE-ACETAMINOPHEN 5-325 MG PO TABS: 1.0000 | ORAL_TABLET | ORAL | Status: DC | PRN

## 2013-04-11 MED ORDER — GATIFLOXACIN 0.5 % OP SOLN: 1.0000 [drp] | Freq: Four times a day (QID) | OPHTHALMIC | Status: DC

## 2013-04-11 MED ORDER — BACITRACIN-POLYMYXIN B 500-10000 UNIT/GM OP OINT: 1.0000 "application " | TOPICAL_OINTMENT | Freq: Four times a day (QID) | OPHTHALMIC | Status: DC

## 2013-04-11 MED ORDER — PREDNISOLONE ACETATE 1 % OP SUSP: 1.0000 [drp] | Freq: Four times a day (QID) | OPHTHALMIC | Status: DC

## 2013-04-11 NOTE — Op Note (Signed)
NAMEMARKAYLA, REICHART NO.:  [id number]  MEDICAL RECORD NO.:  [id number]  LOCATION:  [id number]                        FACILITY:  [facility]  PHYSICIAN:  Chrystie Nose. Zigmund Daniel, M.D. DATE OF BIRTH:  02-26-1955  DATE OF PROCEDURE:  04/10/2013 DATE OF DISCHARGE:                              OPERATIVE REPORT   ADMISSION DIAGNOSIS:  Preretinal fibrosis, right eye.  PROCEDURE:  Pars plana vitrectomy, retinal photocoagulation gas fluid exchange, membrane peel, right eye.  SURGEON:  Chrystie Nose. Zigmund Daniel, M.D.  ASSISTANT:  Deatra Ina, SA.  ANESTHESIA:  General.  DETAILS:  Usual prep and drape, the indirect ophthalmoscope laser was moved into place.  A 350 burns were placed around the retinal periphery in a single row, the power was 400 mW, 1000 microns each and 0.1 seconds each.  A small area of weak retina was surrounded with laser in the lower temporal periphery.  Attention was then carried to the pars plana area where 25-gauge trocars were placed at 8 o'clock and 10 o'clock, and conjunctival incision with 3 layered  scleral incision was made at 2 o'clock to accommodate 20-gauge instruments.  The 25-gauge infusion was placed at 8 o'clock.  Contact lens ring anchored into place at 6 and 12 o'clock.  Provisc placed on the corneal surface and the flat contact lens was placed.  Pars plana vitrectomy was begun just behind the cataractous or the natural lens.  The vitrectomy was carried down to the macular surface in a core fashion.  Dense white membranes were encountered.  These were carefully removed under low suction and rapid cutting.  The magnification was increased and the micropick was used to engage the posterior membrane and lifted from its attachments to the macula.  The 25-gauge forceps were used to pull the membrane at 4 corner attachments carefully watching the retinal surface as the membrane was peeled from the eye in a single piece.  It peeled straight entirely over the  macular region.  There was no macular hole.  Once the membrane was peeled, it was removed with the vitreous cutter.  Additional vitrectomy was carried out to the mid periphery in the far periphery to remove all remaining vitreous.  Once this was accomplished, a 20% gas fluid exchange was carried out.  The instruments removed from the eye.  A 9-0 nylon was used to close the sclerotomy site.  The conjunctiva was closed with wet-field cautery.  A 25-gauge trocars were removed.  The wounds were tested and found to be secure.  Polymyxin and gentamicin were irrigated into tenon space.  Marcaine was injected around the globe for postop pain.  Decadron 10 mg was injected to the lower subconjunctival space.  Closing pressure was 10 with a Barraquer tonometer. Polysporin ophthalmic ointment, patch and shield were placed.  The patient was awakened and taken to recovery in satisfactory condition.  COMPLICATIONS:  None.  Duration 45 minutes.     Chrystie Nose. Zigmund Daniel, M.D.     JDM/MEDQ  D:  04/10/2013  T:  04/11/2013  Job:  [id number]

## 2013-04-11 NOTE — Discharge Summary (Signed)
Discharge summary not needed on [facility] patients per medical records. 

## 2013-04-11 NOTE — Discharge Planning (Signed)
Patient discharged home in stable condition. Verbalizes understanding of all discharge instructions, including home medications and follow up appointments. 

## 2013-04-11 NOTE — Progress Notes (Signed)
04/11/2013, 6:43 AM  Mental Status:  Awake, Alert, Oriented  Anterior segment: Cornea  Clear    Anterior Chamber Clear    Lens:   Clear  Intra Ocular Pressure 16 mmHg with Tonopen  Vitreous: Clear 30%gas bubble   Retina:  Attached Good laser reaction    Impression: Excellent result Retina attached   Final Diagnosis: Principal Problem:   Preretinal fibrosis, right eye Active Problems:   Macular pucker, right eye   Plan: start post operative eye drops.  Discharge to home.  Give post operative instructions  Hayden Pedro 04/11/2013, 6:43 AM

## 2013-04-13 ENCOUNTER — Encounter (HOSPITAL_COMMUNITY): Payer: Self-pay | Admitting: Ophthalmology

## 2013-04-16 ENCOUNTER — Inpatient Hospital Stay (INDEPENDENT_AMBULATORY_CARE_PROVIDER_SITE_OTHER): Payer: BC Managed Care – PPO | Admitting: Ophthalmology

## 2013-04-16 DIAGNOSIS — H35379 Puckering of macula, unspecified eye: Secondary | ICD-10-CM

## 2013-04-17 ENCOUNTER — Inpatient Hospital Stay (INDEPENDENT_AMBULATORY_CARE_PROVIDER_SITE_OTHER): Payer: BC Managed Care – PPO | Admitting: Ophthalmology

## 2013-05-07 ENCOUNTER — Encounter (INDEPENDENT_AMBULATORY_CARE_PROVIDER_SITE_OTHER): Payer: BC Managed Care – PPO | Admitting: Ophthalmology

## 2013-05-07 DIAGNOSIS — H35379 Puckering of macula, unspecified eye: Secondary | ICD-10-CM

## 2013-07-25 ENCOUNTER — Encounter (INDEPENDENT_AMBULATORY_CARE_PROVIDER_SITE_OTHER): Payer: BC Managed Care – PPO | Admitting: Ophthalmology

## 2013-07-25 DIAGNOSIS — H43819 Vitreous degeneration, unspecified eye: Secondary | ICD-10-CM

## 2013-07-25 DIAGNOSIS — H35379 Puckering of macula, unspecified eye: Secondary | ICD-10-CM

## 2013-07-25 DIAGNOSIS — H2510 Age-related nuclear cataract, unspecified eye: Secondary | ICD-10-CM

## 2013-07-25 DIAGNOSIS — H3530 Unspecified macular degeneration: Secondary | ICD-10-CM

## 2014-04-26 ENCOUNTER — Ambulatory Visit (INDEPENDENT_AMBULATORY_CARE_PROVIDER_SITE_OTHER): Payer: BC Managed Care – PPO | Admitting: Ophthalmology

## 2014-05-03 ENCOUNTER — Ambulatory Visit (INDEPENDENT_AMBULATORY_CARE_PROVIDER_SITE_OTHER): Payer: BC Managed Care – PPO | Admitting: Ophthalmology

## 2014-05-20 ENCOUNTER — Ambulatory Visit (INDEPENDENT_AMBULATORY_CARE_PROVIDER_SITE_OTHER): Payer: BLUE CROSS/BLUE SHIELD | Admitting: Ophthalmology

## 2014-05-20 DIAGNOSIS — H35371 Puckering of macula, right eye: Secondary | ICD-10-CM | POA: Diagnosis not present

## 2014-05-20 DIAGNOSIS — H43812 Vitreous degeneration, left eye: Secondary | ICD-10-CM | POA: Diagnosis not present

## 2014-05-20 DIAGNOSIS — H3531 Nonexudative age-related macular degeneration: Secondary | ICD-10-CM

## 2014-05-20 DIAGNOSIS — H2512 Age-related nuclear cataract, left eye: Secondary | ICD-10-CM

## 2015-05-26 ENCOUNTER — Ambulatory Visit (INDEPENDENT_AMBULATORY_CARE_PROVIDER_SITE_OTHER): Payer: BLUE CROSS/BLUE SHIELD | Admitting: Ophthalmology

## 2015-06-18 ENCOUNTER — Ambulatory Visit (INDEPENDENT_AMBULATORY_CARE_PROVIDER_SITE_OTHER): Payer: BLUE CROSS/BLUE SHIELD | Admitting: Ophthalmology

## 2016-05-03 ENCOUNTER — Ambulatory Visit (INDEPENDENT_AMBULATORY_CARE_PROVIDER_SITE_OTHER): Payer: 59 | Admitting: Ophthalmology

## 2016-05-03 DIAGNOSIS — H2512 Age-related nuclear cataract, left eye: Secondary | ICD-10-CM | POA: Diagnosis not present

## 2016-05-03 DIAGNOSIS — H353122 Nonexudative age-related macular degeneration, left eye, intermediate dry stage: Secondary | ICD-10-CM | POA: Diagnosis not present

## 2016-05-03 DIAGNOSIS — H35371 Puckering of macula, right eye: Secondary | ICD-10-CM

## 2016-05-03 DIAGNOSIS — H43812 Vitreous degeneration, left eye: Secondary | ICD-10-CM

## 2016-08-10 ENCOUNTER — Other Ambulatory Visit: Payer: Self-pay | Admitting: Internal Medicine

## 2016-08-10 DIAGNOSIS — Z0000 Encounter for general adult medical examination without abnormal findings: Secondary | ICD-10-CM

## 2016-08-10 DIAGNOSIS — R011 Cardiac murmur, unspecified: Secondary | ICD-10-CM

## 2016-08-17 ENCOUNTER — Ambulatory Visit (HOSPITAL_COMMUNITY): Payer: 59 | Attending: Cardiology

## 2016-08-17 ENCOUNTER — Other Ambulatory Visit: Payer: Self-pay

## 2016-08-17 DIAGNOSIS — R011 Cardiac murmur, unspecified: Secondary | ICD-10-CM

## 2016-09-16 ENCOUNTER — Institutional Professional Consult (permissible substitution): Payer: 59 | Admitting: Neurology

## 2017-01-11 ENCOUNTER — Encounter: Payer: Self-pay | Admitting: Neurology

## 2017-01-12 ENCOUNTER — Ambulatory Visit (INDEPENDENT_AMBULATORY_CARE_PROVIDER_SITE_OTHER): Payer: 59 | Admitting: Neurology

## 2017-01-12 ENCOUNTER — Encounter: Payer: Self-pay | Admitting: Neurology

## 2017-01-12 VITALS — BP 142/81 | HR 77 | Ht 64.0 in | Wt 166.0 lb

## 2017-01-12 DIAGNOSIS — G4730 Sleep apnea, unspecified: Secondary | ICD-10-CM | POA: Diagnosis not present

## 2017-01-12 DIAGNOSIS — R0683 Snoring: Secondary | ICD-10-CM | POA: Diagnosis not present

## 2017-01-12 NOTE — Progress Notes (Signed)
Virginia Carr   Provider:  Larey Carr, Virginia Carr  Primary Care Physician:  Virginia Infante, MD   Referring Provider: Crist Infante, MD   Chief Complaint  Patient presents with  . New Patient (Initial Visit)    pt alone, rm 10. pt has developed major snoring, the nose strips over the counter didnt help. pts husband has told her she snores. she has also been told she stops breathing    HPI:  Virginia Carr is a 61 y.o. female , seen here  in a referral from Virginia Carr for an evaluation of Virginia apnea.   Virginia Carr is a patient of Virginia Carr's who presents today on 12 January 2017 for a Virginia evaluation.  The patient's husband has told her that she snores and he has witnessed her to have apneas at night.  She has tried Breathe Right strips without success, she has tried the ZEN tongue applicator, but often the device does not stay in place. Over the last 10 years she has gained weight, went through menopause, and has developed snoring. She has a cardiac murmur, decreased hearing.     Chief complaint according to patient :  Virginia habits are as follows: The patient usually retreats to the bedroom to read and falls asleep between 930 and 10 PM. She does not have a TV in the bedroom, the bedroom is described as cool, quiet and dark in conducive to Virginia. She sleeps with 3 pillows one for head support 1 between the knees and 1 for body positioning.  The patient usually has 1 or 2 bathroom breaks at night, she may wake up a couple of extra times without the need to go to the bathroom and repositions herself.  For the most she can Virginia through the night without difficulty. Rises spontaneously at 7 AM. Gets over 8 hours of Virginia.  No daytimes naps.    Virginia medical history and family Virginia history: The patient's brother had sleepwalking episodes, father was a CPAP use of his obstructive Virginia apnea in the last 10 years of life.  Mother  used to snore but was never officially  diagnosed. paternal aunt and father had bicuspid valve.   Social history: married, 3 adult children-not gainfully employed . Retired  Surveyor, minerals at [employer]-  remote smoking history until 1974-1980, ETOH- wine drinker 1-2 at PM. Caffeine - 2-3 mugs of coffees in am.   Walking for exercise/ has a stretch and exercise routine. Swims. Skies. Hikes.   Review of Systems: Out of a complete 14 system review, the patient complains of only the following symptoms, and all other reviewed systems are negative.   Epworth score 7  , Fatigue severity score 22  , depression score 1/ 15 - feeling a lack of energy/.    Social History   Socioeconomic History  . Marital status: Married    Spouse name: Not on file  . Number of children: Not on file  . Years of education: Not on file  . Highest education level: Not on file  Social Needs  . Financial resource strain: Not on file  . Food insecurity - worry: Not on file  . Food insecurity - inability: Not on file  . Transportation needs - medical: Not on file  . Transportation needs - non-medical: Not on file  Occupational History  . Not on file  Tobacco Use  . Smoking status: Former Smoker    Types: Cigarettes  . Smokeless tobacco: Never  Used  . Tobacco comment: quit smoking cigarretes 1980  Substance and Sexual Activity  . Alcohol use: Yes    Alcohol/week: 8.4 oz    Types: 14 Glasses of wine per week    Comment: daily  . Drug use: No  . Sexual activity: Not on file  Other Topics Concern  . Not on file  Social History Narrative  . Not on file    Family History  Problem Relation Age of Onset  . Diabetes Mother   . Heart failure Mother   . Stroke Father   . Hypertension Father   . Leukemia Sister   . Cancer Brother   . Heart disease Other     Past Medical History:  Diagnosis Date  . Medical history non-contributory     Past Surgical History:  Procedure Laterality Date  . APPENDECTOMY    . BREAST SURGERY     Hx: of  augmentation  . DILATION AND CURETTAGE OF UTERUS    . LASER PHOTO ABLATION Right 04/10/2013   Procedure: LASER PHOTO ABLATION;  Surgeon: Virginia Pedro, MD;  Location: [facility];  Service: Ophthalmology;  Laterality: Right;  . MEMBRANE PEEL Right 04/10/2013   Procedure: MEMBRANE PEEL;  Surgeon: Virginia Pedro, MD;  Location: [facility];  Service: Ophthalmology;  Laterality: Right;  . PARS PLANA VITRECTOMY Right 04/10/2013   Procedure: PARS PLANA VITRECTOMY WITH 25 GAUGE;  Surgeon: Virginia Pedro, MD;  Location: [facility];  Service: Ophthalmology;  Laterality: Right;  . TUBAL LIGATION      No current outpatient medications on file.   No current facility-administered medications for this visit.     Allergies as of 01/12/2017  . (No Known Allergies)    Vitals: BP (!) 142/81   Pulse 77   Ht 5\' 4"  (1.626 Virginia)   Wt 166 lb (75.3 kg)   BMI 28.49 kg/Virginia  Last Weight:  Wt Readings from Last 1 Encounters:  01/12/17 166 lb (75.3 kg)   KGM:WNUU mass index is 28.49 kg/Virginia.     Last Height:   Ht Readings from Last 1 Encounters:  01/12/17 5\' 4"  (1.626 Virginia)    Physical exam:  General: The patient is awake, alert and appears not in acute distress. The patient is well groomed. Head: Normocephalic, atraumatic. Neck is supple. Mallampati 3,  neck circumference:15. Nasal airflow patent , Retrognathia is not seen.  No goiter.  Cardiovascular:  Regular rate and rhythm, without  murmurs or carotid bruit, and without distended neck veins. Respiratory: Lungs are clear to auscultation. Skin:  Without evidence of edema, or rash Trunk: BMI is 28.5   Neurologic exam : The patient is awake and alert, oriented to place and time.  Mood and affect are appropriate.  Cranial nerves:Pupils are equal and briskly reactive to light. Funduscopic exam without  evidence of pallor or edema. Extraocular movements  in vertical and horizontal planes intact and without nystagmus. Visual fields by finger perimetry are intact. Hearing to  finger rub intact- she has an audiology test pending. Facial sensation intact to fine touch.Facial motor strength is symmetric and tongue and uvula move midline. Shoulder shrug was symmetrical.   Motor exam:   Normal tone, muscle bulk and symmetric strength in all extremities. Sensory:  Fine touch, pinprick and vibration were tested in all extremities. Proprioception tested in the upper extremities was normal. Patient reports that she often at least once a week wakes with numbness in her hands. Coordination: Rapid alternating movements in the fingers/hands  was normal. Finger-to-nose maneuver  normal without evidence of ataxia, dysmetria or tremor.  Gait and station: Patient walks without assistive device -Stance is stable and normal.  Turns with  3 Steps.  Deep tendon reflexes: in the  upper and lower extremities are symmetric and intact. Babinski maneuver response is  downgoing.   Assessment:  After physical and neurologic examination, review of laboratory studies,  Personal review of imaging studies, reports of other /same  Imaging studies, results of polysomnography and / or neurophysiology testing and pre-existing records as far as provided in visit., my assessment is   1) Virginia Carr has no significant neurologic deficits, and her apnea has been a slow insidious development.  She has begun snoring later in adulthood, and probably related to menopausal changes 2.  She gained weight but her main pattern of weight gain also changed to the abdominal area which is a typical precursor for obstructive Virginia apnea in supine Virginia.  She does not wake up from shortness of breath or gasping but occasionally has snort herself awake.. She does not have any significant internal medicine comorbidities.  She gets about 7 if not 8 hours of nocturnal Virginia and I feel she is a good candidate for home Virginia test as a screening test to rule in or out the presence of Virginia apnea, the degree of Virginia apnea.  If she  presents mainly with snoring but very little apnea she may be a good candidate for a dental device and mandibular advancement therapy.  Should her apnea be REM Virginia dependent or associated with low oxygen levels, a CPAP is usually the treatment of choice.  I will put an order in for home Virginia test to be performed in early 2019.   The patient was advised of the nature of the diagnosed disorder , the treatment options and the  risks for general health and wellness arising from not treating the condition.   I spent more than 45 minutes of face to face time with the patient.  Greater than 50% of time was spent in counseling and coordination of care. We have discussed the diagnosis and differential and I answered the patient's questions.    Plan:  Treatment plan and additional workup :  1) HST with watch pat.  20 RV as needed.      Virginia Seat, MD 07/25/1599, 0:93 PM  Certified in Neurology by ABPN Certified in [place] by [facility] [address], [place],  [id number]

## 2017-02-08 ENCOUNTER — Telehealth: Payer: Self-pay | Admitting: Neurology

## 2017-02-08 NOTE — Telephone Encounter (Signed)
We have attempted to call the patient two times to schedule sleep study. Patient has been unavailable at the phone numbers we have on file, and has not returned our calls. At this time, we will send a letter asking patient to please contact the sleep lab.  °

## 2017-03-02 ENCOUNTER — Ambulatory Visit (INDEPENDENT_AMBULATORY_CARE_PROVIDER_SITE_OTHER): Payer: 59 | Admitting: Neurology

## 2017-03-02 DIAGNOSIS — G4733 Obstructive sleep apnea (adult) (pediatric): Secondary | ICD-10-CM

## 2017-03-02 DIAGNOSIS — R0683 Snoring: Secondary | ICD-10-CM

## 2017-03-02 DIAGNOSIS — G4730 Sleep apnea, unspecified: Secondary | ICD-10-CM

## 2017-03-09 ENCOUNTER — Other Ambulatory Visit: Payer: Self-pay | Admitting: Neurology

## 2017-03-09 DIAGNOSIS — G4731 Primary central sleep apnea: Secondary | ICD-10-CM

## 2017-03-09 NOTE — Procedures (Signed)
[facility] [address] [place], [place] [address] NAME:  Virginia Carr                                                                 DOB: 06-11-1955 MEDICAL RECORD NO :  [id number]                                        DOS: 03/02/2017 REFERRING PHYSICIAN: Crist Infante, M.D. STUDY PERFORMED: HST by apnea link HISTORY:  Mrs. Raben's husband has told her that she snores and he has witnessed her to have apneas at night.  She has tried Breathe Right strips without success, she has tried the ZEN tongue applicator, but often the device does not stay in place. Over the last 10 years she has gained weight, went through menopause, and has developed steadily louder snoring. She has a cardiac murmur, decreased hearing. BMI is 28.5. Epworth Sleepiness score endorsed at 7, Fatigue severity score at 22 points.       STUDY RESULTS: Total Recording Time: 9 hours, 35 minutes Total Apnea/Hypopnea Index (AHI):  35.2 /hr. RDI was 38.5/hr.  Average Oxygen Saturation:    92 %, Lowest Oxygen Desaturation: 76 %  Total Time in Oxygen Saturation Below 89% Sp02 was 43 min.  Average Heart Rate: 74 bpm (56 to 113 bpm)  IMPRESSION: Severe sleep apnea with prolonged total time in hypoxemia. The sleep apnea type was listed as "74% unclassified", 25% obstructive and 1% central apneas. Borderline brady- tachy cardia.   RECOMMENDATION: CPAP titration is recommended. Given the unclassified type of apnea, I prefer an attended titration study.  I certify that I have reviewed the raw data recording prior to the issuance of this report in accordance with the standards of the American Academy of Sleep Medicine (AASM).  Larey Seat, M.D.      03-09-2017   Medical Director of [facility] at [facility], and accredited by [place].

## 2017-03-10 ENCOUNTER — Telehealth: Payer: Self-pay | Admitting: Neurology

## 2017-03-10 ENCOUNTER — Telehealth: Payer: Self-pay

## 2017-03-10 NOTE — Telephone Encounter (Signed)

## 2017-03-10 NOTE — Telephone Encounter (Signed)
Can we declare it central ????

## 2017-03-10 NOTE — Telephone Encounter (Signed)
-----   Message from Larey Seat, MD sent at 03/09/2017  5:57 PM EST ----- IMPRESSION: Severe sleep apnea with prolonged total time in  hypoxemia. The sleep apnea type was listed as "74% unclassified", 25%  obstructive and 1% central apneas. Borderline brady- tachy cardia.  RECOMMENDATION: CPAP titration is recommended. Given the  unclassified type of apnea, I prefer an attended titration study.

## 2017-03-10 NOTE — Telephone Encounter (Signed)
[employer] will not approve a cpap titration based on unclassified apnea. They will approve an auto machine and after download it shows unclassified they will approve. Can we get an order for Auto?

## 2017-03-14 NOTE — Telephone Encounter (Signed)
I fixed the report.I will leave at your readers station to addend then will get auth. I will need to send the addend report to them

## 2017-03-15 ENCOUNTER — Other Ambulatory Visit: Payer: Self-pay | Admitting: Neurology

## 2017-03-15 DIAGNOSIS — G4730 Sleep apnea, unspecified: Secondary | ICD-10-CM

## 2017-03-15 DIAGNOSIS — G4731 Primary central sleep apnea: Secondary | ICD-10-CM

## 2017-03-15 NOTE — Progress Notes (Signed)
NAME:  Virginia Carr                                                                 DOB: October 30, 1955 MEDICAL RECORD NO:  [id number]                                        DOS: 03/02/2017 REFERRING PHYSICIAN: Crist Infante, M.D.   ADDENDUM : RESCORED 03-14-2017  STUDY PERFORMED: HST by apnea link   HISTORY:  [name]'s husband has told her that she snores and has witnessed apneas at night.  She has tried Breathe Right strips without success, she has tried the ZEN tongue applicator, but often the device does not stay in place. Over the last 10 years she has gained weight, went through menopause, and has developed steadily louder snoring. She has a cardiac murmur, decreased hearing. BMI is 28.5. Epworth Sleepiness score endorsed at 7, Fatigue severity score at 22 points.       STUDY RESULTS: Total Recording Time: 9 hours, 35 minutes Total Apnea/Hypopnea Index (AHI): 35.2 /hr. RDI was 38.5/hr.  Apnea sub-score was 73% central, 12% obstructive.  Average Oxygen Saturation: 92 %, Lowest Oxygen Desaturation: 76 %  Total Time in Oxygen Saturation Below 89% Sp02 was 43 min.  Average Heart Rate: 74 bpm (56 to 113 bpm)  IMPRESSION: Severe sleep apnea with prolonged total time in hypoxemia. The sleep apnea type was reclassified and score as "73% central", 12% obstructive and 14% unclassified apneas. Borderline brady- tachy cardia.   RECOMMENDATION: CPAP titration is ordered as attended titration study due to central apnea.  I certify that I have reviewed the raw data recording prior to the issuance of this report in accordance with the standards of the American Academy of Sleep Medicine (AASM). AddendumLarey Seat, M.D.      03-14-2017   Medical Director of [facility] at [facility], and accredited by [place].

## 2017-03-16 NOTE — Telephone Encounter (Signed)
Wrote addendum but couldn't post it and used a phone note. CD

## 2017-05-04 ENCOUNTER — Ambulatory Visit (INDEPENDENT_AMBULATORY_CARE_PROVIDER_SITE_OTHER): Payer: 59 | Admitting: Neurology

## 2017-05-04 DIAGNOSIS — Z0289 Encounter for other administrative examinations: Secondary | ICD-10-CM

## 2017-05-04 DIAGNOSIS — G4731 Primary central sleep apnea: Secondary | ICD-10-CM | POA: Diagnosis not present

## 2017-05-05 ENCOUNTER — Ambulatory Visit (INDEPENDENT_AMBULATORY_CARE_PROVIDER_SITE_OTHER): Payer: 59 | Admitting: Ophthalmology

## 2017-05-10 NOTE — Progress Notes (Signed)
PATIENT'S NAME:  Virginia Carr, Virginia Carr DOB:      [birth year]      MR#:    [id number]     DATE OF RECORDING: 05/04/2017 REFERRING M.D.:  Crist Infante MD Study Performed:   CPAP  Titration HISTORY:   HST STUDY RESULTS from 03/02/17: Total Recording Time: 9 hours, 35 minutesTotal Apnea/Hypopnea Index (AHI):  35.2 /hr. RDI was 38.5/hr. Average Oxygen Saturation:    92 %, Lowest Oxygen Desaturation: 76 % Total Time in Oxygen Saturation Below 89% Sp02 was 43 min.  The patient endorsed the Epworth Sleepiness Scale at 15/24 points.    The patient's weight 166 pounds with a height of 64 (inches), resulting in a BMI of 28.2 kg/m2.  The patient's neck circumference measured 15 inches.  CURRENT MEDICATIONS: None    PROCEDURE:  This is a multichannel digital polysomnogram utilizing the SomnoStar 11.2 system.  Electrodes and sensors were applied and monitored per AASM Specifications.   EEG, EOG, Chin and Limb EMG, were sampled at 200 Hz.  ECG, Snore and Nasal Pressure, Thermal Airflow, Respiratory Effort, CPAP Flow and Pressure, Oximetry was sampled at 50 Hz. Digital video and audio were recorded.       CPAP was initiated at 5 cmH20 with heated humidity per AASM split night standards and pressure was advanced to 8cmH20 because of hypopneas, apneas and desaturations.  At a PAP pressure of 8 cmH20, there was a reduction of the AHI to 0 with improvement of the above symptoms of obstructive sleep apnea.    Lights Out was at 22:15 and Lights On at 05:00. Total recording time (TRT) was 405 minutes, with a total sleep time (TST) of 286.5 minutes. The patient's sleep latency was 42 minutes with 0 minutes of wake time after sleep onset. REM latency was 83 minutes.  The sleep efficiency was 70.7 %.    SLEEP ARCHITECTURE: WASO (Wake after sleep onset)  was 76.5 minutes.  There were 5.5 minutes in Stage N1, 150 minutes Stage N2, 56 minutes Stage N3 and 75 minutes in Stage REM.  The percentage of Stage N1 was 1.9%, Stage N2 was  52.4%, Stage N3 was 19.5% and Stage R (REM sleep) was 26.2%.   The arousals were noted as: 12 were spontaneous, 1 were associated with PLMs, 7 were associated with respiratory events.  She took one bathroom break.   EKG was in keeping with normal sinus rhythm (NSR).   RESPIRATORY ANALYSIS:  There was a total of 8 respiratory events: 0 obstructive apneas, 0 central apneas and 0 mixed apneas with a total of 0 apneas and an apnea index (AI) of 0 /hour. There were 8 hypopneas with a hypopnea index of 1.7/hour. The patient also had 0 respiratory event related arousals (RERAs).      The total APNEA/HYPOPNEA INDEX  (AHI) was 1.7 /hour and the total RESPIRATORY DISTURBANCE INDEX was 1.7 .hour  3 events occurred in REM sleep and 5 events in NREM. The REM AHI was 2.4 /hour versus a non-REM AHI of 1.4 /hour.  The patient spent 93 minutes of total sleep time in the supine position and 194 minutes in non-supine. The supine AHI was 4.5, versus a non-supine AHI of 0.3.  OXYGEN SATURATION & C02:  The baseline 02 saturation was 94%, with the lowest being 84%. Time spent below 89% saturation equaled 1 minutes.  PERIODIC LIMB MOVEMENTS:    The patient had a total of 13 Periodic Limb Movements. The Periodic Limb Movement (PLM) index was  2.7 and the PLM Arousal index was .2 /hour.   The Technoligist noted the patient was fitted with a Microbiologist P10 (Small) apparatus.  DIAGNOSIS 1. Moderate OSA 2. OSA was well controlled on CPAP + 8 cm H2O   PLANS/RECOMMENDATIONS:   1. Patient with history of moderate OSA  (ESS= 15/24 and BMI= 28.2).  Would place on CPAP of 8 cm/H2O.   2. A follow up appointment will be scheduled in the Sleep Clinic at [facility].   Please call [phone] with any questions.      I certify that I have reviewed the entire raw data recording prior to the issuance of this report in accordance with the Standards of Accreditation of the [place] Academy of Sleep Medicine  (AASM)   Lyndall Windt A. Felecia Shelling, MD, PhD, FAAN Certified in Neurology, Clinical Neurophysiology, Sleep Medicine, Pain Medicine and Neuroimaging Director, [place] at [facility] [address] [place], [place] [address] [phone]

## 2017-05-11 ENCOUNTER — Telehealth: Payer: Self-pay | Admitting: Neurology

## 2017-05-11 ENCOUNTER — Other Ambulatory Visit: Payer: Self-pay | Admitting: Neurology

## 2017-05-11 DIAGNOSIS — G4730 Sleep apnea, unspecified: Secondary | ICD-10-CM

## 2017-05-11 NOTE — Telephone Encounter (Signed)
I called pt. I advised pt that Dr. Felecia Shelling reviewed their sleep study results on behalf of Dr Dohmeier and found that pt has sleep apnea. Dr. Felecia Shelling recommends that pt starts CPAP at a pressure of 8 cm of water pressure. I reviewed PAP compliance expectations with the pt. Pt is agreeable to starting a CPAP. Pt had several questions and I was able to review answers with her. I advised pt that an order will be sent to a DME, [facility], and [facility] will call the pt within about one week after they file with the pt's insurance. [facility] will show the pt how to use the machine, fit for masks, and troubleshoot the CPAP if needed. A letter with all of this information in it will be mailed to the pt as a reminder. I verified with the pt that the address we have on file is correct. Pt verbalized understanding of results. Pt had no questions at this time but was encouraged to call back if questions arise.        DIAGNOSIS 1. Moderate OSA 2. OSA was well controlled on CPAP + 8 cm H2O   PLANS/RECOMMENDATIONS:   1. Patient with history of moderate OSA  (ESS= 15/24 and BMI= 28.2).  Would place on CPAP of 8 cm/H2O.

## 2017-06-21 NOTE — Telephone Encounter (Signed)
Received a notification from [facility] [place] that they have attempted to call the pt 3 separate times and there has been no reply.

## 2017-07-07 NOTE — Telephone Encounter (Signed)
Verified the pt started on 6/6 and this apt will work for her follow up visit.

## 2017-07-07 NOTE — Telephone Encounter (Signed)
Scheduled pts initial cpap f/u with NP Hoyle Sauer 8/14 FYI

## 2017-09-03 ENCOUNTER — Encounter: Payer: Self-pay | Admitting: Nurse Practitioner

## 2017-09-06 NOTE — Progress Notes (Signed)
[facility]  PATIENT: Virginia Carr DOB: [birth year]   REASON FOR VISIT: Follow-up for newly diagnosed obstructive sleep apnea here for initial CPAP compliance HISTORY [place] ILLNESS:UPDATE 8/14/2019[name], 62 year old female returns for follow-up with newly diagnosed obstructive sleep apnea here for initial CPAP compliance.  She claims she has not had any problems getting adjusted to her machine she is much less fatigued.  Compliance data dated 08/05/2017-09/03/2017 shows compliance greater than 4 hours at 100%.  Average usage 8 hours 9 minutes.  Set pressure 8 cm EPR level 3.  Leak 95th percentile 16.4.  AHI 2.5.  ESS 4.  She returns for reevaluation 12/19/18CDJane P Carr is a 62 y.o. female , seen here  in a referral from Dr. Joylene Draft for an evaluation of sleep apnea.   Virginia Carr is a patient of Dr. Elta Guadeloupe Perini's who presents today on 12 January 2017 for a sleep evaluation.  The patient's husband has told her that she snores and he has witnessed her to have apneas at night.  She has tried Breathe Right strips without success, she has tried the ZEN tongue applicator, but often the device does not stay in place. Over the last 10 years she has gained weight, went through menopause, and has developed snoring. She has a cardiac murmur, decreased hearing.     Chief complaint according to patient :  Sleep habits are as follows: The patient usually retreats to the bedroom to read and falls asleep between 930 and 10 PM. She does not have a TV in the bedroom, the bedroom is described as cool, quiet and dark in conducive to sleep. She sleeps with 3 pillows one for head support 1 between the knees and 1 for body positioning.  The patient usually has 1 or 2 bathroom breaks at night, she may wake up a couple of extra times without the need to go to the bathroom and repositions herself.  For the most she can sleep through the night without  difficulty. Rises spontaneously at 7 AM. Gets over 8 hours of sleep.  No daytimes naps.    REVIEW OF SYSTEMS: Full 14 system review of systems performed and notable only for those listed, all others are neg:  Constitutional: neg  Cardiovascular: neg Ear/Nose/Throat: neg  Skin: neg Eyes: neg Respiratory: neg Gastroitestinal: neg  Hematology/Lymphatic: neg  Endocrine: neg Musculoskeletal:neg Allergy/Immunology: neg Neurological: neg Psychiatric: neg Sleep : Obstructive sleep apnea with CPAP   ALLERGIES: No Known Allergies  HOME MEDICATIONS: No outpatient medications prior to visit.   No facility-administered medications prior to visit.     PAST MEDICAL HISTORY: Past Medical History:  Diagnosis Date  . Medical history non-contributory     PAST SURGICAL HISTORY: Past Surgical History:  Procedure Laterality Date  . APPENDECTOMY    . BREAST SURGERY     Hx: of augmentation  . DILATION AND CURETTAGE OF UTERUS    . LASER PHOTO ABLATION Right 04/10/2013   Procedure: LASER PHOTO ABLATION;  Surgeon: Hayden Pedro, MD;  Location: [place];  Service: Ophthalmology;  Laterality: Right;  . MEMBRANE PEEL Right 04/10/2013   Procedure: MEMBRANE PEEL;  Surgeon: Hayden Pedro, MD;  Location: [place];  Service: Ophthalmology;  Laterality: Right;  . PARS PLANA VITRECTOMY Right 04/10/2013   Procedure: PARS PLANA VITRECTOMY WITH 25 GAUGE;  Surgeon: Hayden Pedro, MD;  Location: [place];  Service: Ophthalmology;  Laterality: Right;  . TUBAL LIGATION  FAMILY HISTORY: Family History  Problem Relation Age of Onset  . Diabetes Mother   . Heart failure Mother   . Stroke Father   . Hypertension Father   . Leukemia Sister   . Cancer Brother   . Heart disease Other     SOCIAL HISTORY: Social History   Socioeconomic History  . Marital status: Married    Spouse name: Not on file  . Number of children: Not on file  . Years of education: Not on file  . Highest education level: Not  on file  Occupational History  . Not on file  Social Needs  . Financial resource strain: Not on file  . Food insecurity:    Worry: Not on file    Inability: Not on file  . Transportation needs:    Medical: Not on file    Non-medical: Not on file  Tobacco Use  . Smoking status: Former Smoker    Types: Cigarettes  . Smokeless tobacco: Never Used  . Tobacco comment: quit smoking cigarretes 1980  Substance and Sexual Activity  . Alcohol use: Yes    Alcohol/week: 14.0 standard drinks    Types: 14 Glasses of wine per week    Comment: daily  . Drug use: No  . Sexual activity: Not on file  Lifestyle  . Physical activity:    Days per week: Not on file    Minutes per session: Not on file  . Stress: Not on file  Relationships  . Social connections:    Talks on phone: Not on file    Gets together: Not on file    Attends religious service: Not on file    Active member of club or organization: Not on file    Attends meetings of clubs or organizations: Not on file    Relationship status: Not on file  . Intimate partner violence:    Fear of current or ex partner: Not on file    Emotionally abused: Not on file    Physically abused: Not on file    Forced sexual activity: Not on file  Other Topics Concern  . Not on file  Social History Narrative  . Not on file     PHYSICAL EXAM  Vitals:   09/07/17 1417  BP: 137/83  Pulse: 79  Weight: 170 lb 3.2 oz (77.2 kg)  Height: 5\' 4"  (1.626 m)   Body mass index is 29.21 kg/m.  Generalized: Well developed, in no acute distress  Head: normocephalic and atraumatic,. Oropharynx benign mallopatti 3 Neck: Supple, circumference 15 Musculoskeletal: No deformity  Skin no peripheral edema Neurological examination   Mentation: Alert oriented to time, place, history taking. Attention span and concentration appropriate. Recent and remote memory intact.  Follows all commands speech and language fluent.   Cranial nerve II-XII: Pupils were  equal round reactive to light extraocular movements were full, visual field were full on confrontational test. Facial sensation and strength were normal. hearing was intact to finger rubbing bilaterally. Uvula tongue midline. head turning and shoulder shrug were normal and symmetric.Tongue protrusion into cheek strength was normal. Motor: normal bulk and tone, full strength in the BUE, BLE,  Sensory: normal and symmetric to light touch,  Coordination: finger-nose-finger, heel-to-shin bilaterally, no dysmetria Gait and Station: Rising up from seated position without assistance, normal stance,  moderate stride, good arm swing, smooth turning, able to perform tiptoe, and heel walking without difficulty. Tandem gait is steady  DIAGNOSTIC DATA (LABS, IMAGING, TESTING) - I reviewed patient  records, labs, notes, testing and imaging myself where available.  Lab Results  Component Value Date   WBC 8.3 04/10/2013   HGB 15.0 04/10/2013   HCT 43.3 04/10/2013   MCV 94.1 04/10/2013   PLT 238 04/10/2013      Component Value Date/Time   NA 142 04/10/2013 0858   K 4.0 04/10/2013 0858   CL 102 04/10/2013 0858   CO2 25 04/10/2013 0858   GLUCOSE 104 (H) 04/10/2013 0858   BUN 12 04/10/2013 0858   CREATININE 0.89 04/10/2013 0858   CALCIUM 9.4 04/10/2013 0858   PROT 7.1 04/10/2013 0858   ALBUMIN 4.1 04/10/2013 0858   AST 22 04/10/2013 0858   ALT 20 04/10/2013 0858   ALKPHOS 70 04/10/2013 0858   BILITOT 0.6 04/10/2013 0858   GFRNONAA 70 (L) 04/10/2013 0858   GFRAA 81 (L) 04/10/2013 0858    ASSESSMENT AND PLAN  62 y.o. year old female here to follow-up for newly diagnosed obstructive sleep apnea with initial CPAP compliance.Data dated 08/05/2017-09/03/2017 shows compliance greater than 4 hours at 100%.  Average usage 8 hours 9 minutes.  Set pressure 8 cm EPR level 3.  Leak 95th percentile 16.4.  AHI 2.5.  ESS 4   PLAN: CPAP compliance 100% reviewed data with patient Continue same settings Be  physically active for overall health and well-being Follow-up yearly and as needed Dennie Bible, [facility], [facility], APRN  [facility] [address] [place], [place] [address] [phone]

## 2017-09-07 ENCOUNTER — Encounter: Payer: Self-pay | Admitting: Nurse Practitioner

## 2017-09-07 ENCOUNTER — Ambulatory Visit (INDEPENDENT_AMBULATORY_CARE_PROVIDER_SITE_OTHER): Payer: 59 | Admitting: Nurse Practitioner

## 2017-09-07 DIAGNOSIS — Z9989 Dependence on other enabling machines and devices: Secondary | ICD-10-CM | POA: Diagnosis not present

## 2017-09-07 DIAGNOSIS — G4733 Obstructive sleep apnea (adult) (pediatric): Secondary | ICD-10-CM | POA: Diagnosis not present

## 2017-09-07 NOTE — Patient Instructions (Signed)
CPAP compliance 100% Continue same settings Follow-up yearly and as needed  

## 2017-09-28 NOTE — Progress Notes (Signed)
I agree with the assessment and plan as directed by NP .The patient is known to me .   Nycere Presley, MD  

## 2018-09-12 ENCOUNTER — Ambulatory Visit (INDEPENDENT_AMBULATORY_CARE_PROVIDER_SITE_OTHER): Payer: 59 | Admitting: Neurology

## 2018-09-12 ENCOUNTER — Other Ambulatory Visit: Payer: Self-pay

## 2018-09-12 ENCOUNTER — Encounter: Payer: Self-pay | Admitting: Neurology

## 2018-09-12 VITALS — BP 127/80 | HR 72 | Temp 98.2°F | Ht 64.5 in | Wt 159.0 lb

## 2018-09-12 DIAGNOSIS — G4731 Primary central sleep apnea: Secondary | ICD-10-CM | POA: Diagnosis not present

## 2018-09-12 DIAGNOSIS — R0683 Snoring: Secondary | ICD-10-CM

## 2018-09-12 DIAGNOSIS — Z9989 Dependence on other enabling machines and devices: Secondary | ICD-10-CM

## 2018-09-12 DIAGNOSIS — G4733 Obstructive sleep apnea (adult) (pediatric): Secondary | ICD-10-CM | POA: Diagnosis not present

## 2018-09-12 NOTE — Patient Instructions (Signed)

## 2018-09-12 NOTE — Progress Notes (Signed)
[facility]  PATIENT: Virginia Carr DOB: 03-22-1955   REASON FOR VISIT: Follow-up for newly diagnosed obstructive sleep apnea here for initial CPAP compliance HISTORY FROM: patient    09-12-2018, RV -  Complex sleep apnea, a 63 year old caucasian female patient , Virginia Carr has been seen here in a regular routine revisit she does not have any medical urgencies or emergencies in the past 12 months.  As we know she is a former smoker but quit 40 years ago, she originally complained of snoring was diagnosed with sleep apnea and has been treated with CPAP.  She was followed by nurse practitioner Cecille Rubin who has been very tired.  The patient has been highly compliant for the last 30 days related encompassed 30 days prior to 14 August 2018.  Average user time 7 hours 33 minutes, set pressure is 8 cmH2O with 3 cm expiratory pressure relief and a residual AHI of only 1.9 apneas per hour.  Very few central apneas emerging 95th percentile air leak is 16.9.  It seems that there has been more air leakage since the beginning of July which may have been related to not having supplies. She uses a nasal pillow.    8/14/2019[name], 63 year old female returns for follow-up with newly diagnosed obstructive sleep apnea here for initial CPAP compliance.  She claims she has not had any problems getting adjusted to her machine she is much less fatigued.  Compliance data dated 08/05/2017-09/03/2017 shows compliance greater than 4 hours at 100%.  Average usage 8 hours 9 minutes.  Set pressure 8 cm EPR level 3.  Leak 95th percentile 16.4.  AHI 2.5.  ESS 4.  She returns for reevaluation 12/19/18CDJane P Carr is a 63 y.o. female , seen here  in a referral from Dr. Joylene Draft for an evaluation of sleep apnea.  Virginia Carr is a patient of Dr. Elta Guadeloupe Perini's who presents today on 12 January 2017 for a sleep evaluation.  The patient's husband has told her that she snores and he has witnessed her to  have apneas at night.  She has tried Breathe Right strips without success, she has tried the ZEN tongue applicator, but often the device does not stay in place. Over the last 10 years she has gained weight, went through menopause, and has developed snoring. She has a cardiac murmur, decreased hearing.     Chief complaint according to patient : CD  Sleep habits are as follows: The patient usually retreats to the bedroom to read and falls asleep between 930 and 10 PM. She does not have a TV in the bedroom, the bedroom is described as cool, quiet and dark in conducive to sleep. She sleeps with 3 pillows one for head support 1 between the knees and 1 for body positioning.  The patient usually has 1 or 2 bathroom breaks at night, she may wake up a couple of extra times without the need to go to the bathroom and repositions herself.  For the most she can sleep through the night without difficulty. Rises spontaneously at 7 AM. Gets over 8 hours of sleep.  No daytimes naps.    REVIEW OF SYSTEMS: Full 14 system review of systems performed and notable only: OSA on CPAP.   How likely are you to doze in the following situations: 0 = not likely, 1 = slight chance, 2 = moderate chance, 3 = high chance  Sitting and Reading? Watching Television? Sitting inactive in a public place (  theater or meeting)? Lying down in the afternoon when circumstances permit? Sitting and talking to someone? Sitting quietly after lunch without alcohol? In a car, while stopped for a few minutes in traffic? As a passenger in a car for an hour without a break?  Total =5/ 25   And FFSS at 22/ 63.     ALLERGIES: No Known Allergies  HOME MEDICATIONS: Outpatient Medications Prior to Visit  Medication Sig Dispense Refill  . valACYclovir (VALTREX) 500 MG tablet TAKE 1 TABLET TWICE A DAY FOR 3 DAYS AS NEEDED     No facility-administered medications prior to visit.     PAST MEDICAL HISTORY: Past Medical History:   Diagnosis Date  . Medical history non-contributory     PAST SURGICAL HISTORY: Past Surgical History:  Procedure Laterality Date  . APPENDECTOMY    . BREAST SURGERY     Hx: of augmentation  . DILATION AND CURETTAGE OF UTERUS    . LASER PHOTO ABLATION Right 04/10/2013   Procedure: LASER PHOTO ABLATION;  Surgeon: Hayden Pedro, MD;  Location: [place];  Service: Ophthalmology;  Laterality: Right;  . MEMBRANE PEEL Right 04/10/2013   Procedure: MEMBRANE PEEL;  Surgeon: Hayden Pedro, MD;  Location: [place];  Service: Ophthalmology;  Laterality: Right;  . PARS PLANA VITRECTOMY Right 04/10/2013   Procedure: PARS PLANA VITRECTOMY WITH 25 GAUGE;  Surgeon: Hayden Pedro, MD;  Location: [place];  Service: Ophthalmology;  Laterality: Right;  . TUBAL LIGATION      FAMILY HISTORY: Family History  Problem Relation Age of Onset  . Diabetes Mother   . Heart failure Mother   . Stroke Father   . Hypertension Father   . Leukemia Sister   . Cancer Brother   . Heart disease Other     SOCIAL HISTORY: Social History   Socioeconomic History  . Marital status: Married    Spouse name: Not on file  . Number of children: Not on file  . Years of education: Not on file  . Highest education level: Not on file  Occupational History  . Not on file  Social Needs  . Financial resource strain: Not on file  . Food insecurity    Worry: Not on file    Inability: Not on file  . Transportation needs    Medical: Not on file    Non-medical: Not on file  Tobacco Use  . Smoking status: Former Smoker    Types: Cigarettes  . Smokeless tobacco: Never Used  . Tobacco comment: quit smoking cigarretes 1980  Substance and Sexual Activity  . Alcohol use: Yes    Alcohol/week: 14.0 standard drinks    Types: 14 Glasses of wine per week    Comment: daily  . Drug use: No  . Sexual activity: Not on file  Lifestyle  . Physical activity    Days per week: Not on file    Minutes per session: Not on file  . Stress:  Not on file  Relationships  . Social Herbalist on phone: Not on file    Gets together: Not on file    Attends religious service: Not on file    Active member of club or organization: Not on file    Attends meetings of clubs or organizations: Not on file    Relationship status: Not on file  . Intimate partner violence    Fear of current or ex partner: Not on file    Emotionally abused: Not  on file    Physically abused: Not on file    Forced sexual activity: Not on file  Other Topics Concern  . Not on file  Social History Narrative  . Not on file     PHYSICAL EXAM  Vitals:   09/12/18 1319  BP: 127/80  Pulse: 72  Temp: 98.2 F (36.8 C)  Weight: 159 lb (72.1 kg)  Height: 5' 4.5" (1.638 m)   Body mass index is 26.87 kg/m.  Generalized: Well developed, in no acute distress  Head: normocephalic and atraumatic,. Oropharynx benign mallopatti 3 Neck: Supple, circumference 15 Musculoskeletal: No deformity  Skin no peripheral edema Neurological examination   Mentation: Alert oriented to time, place, history taking. Attention span and concentration appropriate. Recent and remote memory intact.  Follows all commands speech and language fluent.   Cranial nerve:  No loss of smell or taste.  Pupils were equal round reactive to light and extraocular movements were full, Facial sensation and strength were normal. hearing was intact to finger rubbing bilaterally.  Uvula and tongue midline.Mallampatti 3.  Full ROM for  head turning and shoulder shrug - symmetric.Motor: normal bulk and tone, full strength. Sensory: normal and symmetric to light touch,  Coordination: finger-nose-finger bilaterally, no dysmetria Gait and Station: Rising up from seated position without assistance, normal stance,  moderate .   DIAGNOSTIC DATA (LABS, IMAGING, TESTING) - I reviewed patient records, labs, notes, testing and imaging myself where available.    ASSESSMENT AND PLAN  63 y.o. year  old female here to follow-up for newly diagnosed obstructive sleep apnea with initial CPAP compliance.Data dated 08/05/2017-09/03/2017 shows compliance greater than 4 hours at 100%.  Average usage 8 hours 9 minutes.  Set pressure 8 cm EPR level 3.  Leak 95th percentile 16.4.  AHI 2.5.  ESS 4   PLAN: Nasal pillow in medium size.  CPAP compliance 93% and 100% of days.  reviewed data with patient- AEROCAREContinue same settings- patient has fresh supplies.  Be physically active for overall health and well-being, hydrate well, aim for high protein intake.  Follow-up yearly and as needed   Larey Seat, MD  09-12-2018   [facility] [address], [place], [place] [address] [phone]

## 2019-12-04 ENCOUNTER — Other Ambulatory Visit: Payer: Self-pay | Admitting: Internal Medicine

## 2019-12-04 DIAGNOSIS — E785 Hyperlipidemia, unspecified: Secondary | ICD-10-CM

## 2020-01-07 ENCOUNTER — Ambulatory Visit
Admission: RE | Admit: 2020-01-07 | Discharge: 2020-01-07 | Disposition: A | Discharge status: Home / Self Care | Payer: 59 | Source: Ambulatory Visit | Attending: Internal Medicine | Admitting: Internal Medicine

## 2020-01-07 DIAGNOSIS — E785 Hyperlipidemia, unspecified: Secondary | ICD-10-CM

## 2020-02-21 ENCOUNTER — Encounter: Payer: Self-pay | Admitting: Neurology

## 2020-02-21 ENCOUNTER — Telehealth (INDEPENDENT_AMBULATORY_CARE_PROVIDER_SITE_OTHER): Payer: 59 | Admitting: Neurology

## 2020-02-21 DIAGNOSIS — Z9989 Dependence on other enabling machines and devices: Secondary | ICD-10-CM

## 2020-02-21 DIAGNOSIS — H35371 Puckering of macula, right eye: Secondary | ICD-10-CM | POA: Diagnosis not present

## 2020-02-21 DIAGNOSIS — G4733 Obstructive sleep apnea (adult) (pediatric): Secondary | ICD-10-CM | POA: Diagnosis not present

## 2020-02-21 NOTE — Progress Notes (Addendum)
SLEEP MEDICINE CLINIC   PATIENT: Virginia Carr DOB: 1955/07/05   REASON FOR VISIT: Follow-up for CPAP compliance/ Virtual visit . HISTORY FROM: patient  Virtual Visit via Video Note  I connected with Virginia Carr on 02/21/2020 by a video enabled telemedicine application and verified that I am speaking with the correct person using two identifiers.  Location: Patient: at home Provider: at her office, [facility].  I discussed the limitations of evaluation and management by telemedicine and the availability of in person appointments. The patient expressed understanding and agreed to proceed     02-21-2020, RV by Video with CD  Interval history- This patient is a meanwhile 65 year-old Caucasian female patient using CPAP compliantly for complex sleep apnea treatment.  She received the CPAP machine 06-28-2017.  She reports sleeping well with CPAP, she needs humidification to avoid waking with headaches, has recently changed to a ResMed I nasal cradle mask in medium, but the new halo is a little too large . She changed headgear just 01-31-2020 and some air-leaks have arisen since. She travel with CPAP , noticed the difference if she doesn't. We discussed the possibility of a travel CPAP purchase when she plans/ hopes to travel again. She may do just as well with a refurbished second CPAP.   Assessment :   Residual AHI was 1.9/h and compliance 100% with an average user time of 8 hours and 12 minutes.  CPAP is SET to 8 cm water with 3 cm EPR and no Cheyne Stokes respirations were present. Epworth score under 10/24 points.  PLAN:  Rv in 12 month , write script for smaller headgear, smaller than standard.       09-12-2018, RV -  Complex sleep apnea, a 65 year old caucasian female patient , Virginia Carr has been seen here in a regular routine revisit she does not have any medical urgencies or emergencies in the past 12 months.  As we know she is a former smoker but quit 40 years ago, she originally  complained of snoring was diagnosed with sleep apnea and has been treated with CPAP.  She was followed by nurse practitioner Cecille Rubin who has been very tired.  The patient has been highly compliant for the last 30 days related encompassed 30 days prior to 14 August 2018.  Average user time 7 hours 33 minutes, set pressure is 8 cmH2O with 3 cm expiratory pressure relief and a residual AHI of only 1.9 apneas per hour.  Very few central apneas emerging 95th percentile air leak is 16.9.  It seems that there has been more air leakage since the beginning of July which may have been related to not having supplies. She uses a nasal pillow.    8/14/2019[name], 65 year old female returns for follow-up with newly diagnosed obstructive sleep apnea here for initial CPAP compliance.  She claims she has not had any problems getting adjusted to her machine she is much less fatigued.  Compliance data dated 08/05/2017-09/03/2017 shows compliance greater than 4 hours at 100%.  Average usage 8 hours 9 minutes.  Set pressure 8 cm EPR level 3.  Leak 95th percentile 16.4.  AHI 2.5.  ESS 4.  She returns for reevaluation 12/19/18CDJane P Carr is a 65 y.o. female , seen here  in a referral from Dr. Joylene Draft for an evaluation of sleep apnea.  Virginia Carr is a patient of Dr. Elta Guadeloupe [facility] who presents today on 12 January 2017 for a sleep evaluation.  The patient's  husband has told her that she snores and he has witnessed her to have apneas at night.  She has tried Breathe Right strips without success, she has tried the ZEN tongue applicator, but often the device does not stay in place. Over the last 10 years she has gained weight, went through menopause, and has developed snoring. She has a cardiac murmur, decreased hearing.     Chief complaint according to patient : CD  Sleep habits are as follows: The patient usually retreats to the bedroom to read and falls asleep between 930 and 10 PM. She does not have a TV in  the bedroom, the bedroom is described as cool, quiet and dark in conducive to sleep. She sleeps with 3 pillows one for head support 1 between the knees and 1 for body positioning.  The patient usually has 1 or 2 bathroom breaks at night, she may wake up a couple of extra times without the need to go to the bathroom and repositions herself.  For the most she can sleep through the night without difficulty. Rises spontaneously at 7 AM. Gets over 8 hours of sleep.  No daytimes naps.    REVIEW OF SYSTEMS: Full 14 system review of systems performed and notable only: OSA on CPAP.   How likely are you to doze in the following situations: 0 = not likely, 1 = slight chance, 2 = moderate chance, 3 = high chance  Sitting and Reading? Watching Television? Sitting inactive in a public place (theater or meeting)? Lying down in the afternoon when circumstances permit? Sitting and talking to someone? Sitting quietly after lunch without alcohol? In a car, while stopped for a few minutes in traffic? As a passenger in a car for an hour without a break?  Total =5/ 25   And FFSS at 22/ 63.     ALLERGIES: No Known Allergies  HOME MEDICATIONS: Outpatient Medications Prior to Visit  Medication Sig Dispense Refill  . valACYclovir (VALTREX) 500 MG tablet TAKE 1 TABLET TWICE A DAY FOR 3 DAYS AS NEEDED     No facility-administered medications prior to visit.    PAST MEDICAL HISTORY: Past Medical History:  Diagnosis Date  . Medical history non-contributory     PAST SURGICAL HISTORY: Past Surgical History:  Procedure Laterality Date  . APPENDECTOMY    . BREAST SURGERY     Hx: of augmentation  . DILATION AND CURETTAGE OF UTERUS    . LASER PHOTO ABLATION Right 04/10/2013   Procedure: LASER PHOTO ABLATION;  Surgeon: Hayden Pedro, MD;  Location: [place];  Service: Ophthalmology;  Laterality: Right;  . MEMBRANE PEEL Right 04/10/2013   Procedure: MEMBRANE PEEL;  Surgeon: Hayden Pedro, MD;  Location:  [place];  Service: Ophthalmology;  Laterality: Right;  . PARS PLANA VITRECTOMY Right 04/10/2013   Procedure: PARS PLANA VITRECTOMY WITH 25 GAUGE;  Surgeon: Hayden Pedro, MD;  Location: [place];  Service: Ophthalmology;  Laterality: Right;  . TUBAL LIGATION      FAMILY HISTORY: Family History  Problem Relation Age of Onset  . Diabetes Mother   . Heart failure Mother   . Stroke Father   . Hypertension Father   . Leukemia Sister   . Cancer Brother   . Heart disease Other     SOCIAL HISTORY: Social History   Socioeconomic History  . Marital status: Married    Spouse name: Not on file  . Number of children: Not on file  . Years of  education: Not on file  . Highest education level: Not on file  Occupational History  . Not on file  Tobacco Use  . Smoking status: Former Smoker    Types: Cigarettes  . Smokeless tobacco: Never Used  . Tobacco comment: quit smoking cigarretes 1980  Substance and Sexual Activity  . Alcohol use: Yes    Alcohol/week: 14.0 standard drinks    Types: 14 Glasses of wine per week    Comment: daily  . Drug use: No  . Sexual activity: Not on file  Other Topics Concern  . Not on file  Social History Narrative  . Not on file   Social Determinants of Health   Financial Resource Strain: Not on file  Food Insecurity: Not on file  Transportation Needs: Not on file  Physical Activity: Not on file  Stress: Not on file  Social Connections: Not on file  Intimate Partner Violence: Not on file     PHYSICAL EXAM  There were no vitals filed for this visit. There is no height or weight on file to calculate BMI.  Generalized: Well developed, in no acute distress   Mentation: Alert oriented to time, place, history taking. Attention span and concentration appropriate. Recent and remote memory intact.  Follows all commands speech and language fluent.     ASSESSMENT AND PLAN Follow Up Instructions:    I discussed the assessment and treatment plan with  the patient. The patient was provided an opportunity to ask questions and all were answered. The patient agreed with the plan and demonstrated an understanding of the instructions.   The patient was advised to call back or seek an in-person evaluation if the symptoms worsen or if the condition fails to improve as anticipated.  I provided a non-face-to-face encounter.  Assessment :   Residual AHI was 1.9/h and compliance 100% with an average user time of 8 hours and 12 minutes.  CPAP is SET to 8 cm water with 3 cm EPR and no Cheyne Stokes respirations were present. Epworth score under 10/24 points.  PLAN:  Rv in 12 month , write script for smaller headgear, smaller than standard.    Be physically active for overall health and well-being, hydrate well, aim for high protein intake.  Follow-up yearly and as needed  Cc Dr Deloria Lair, MD  Dr. Crist Infante, MD    Larey Seat, MD  02-21-2020  [facility] [address], [place], [place] [address] [phone]

## 2020-09-11 ENCOUNTER — Telehealth: Payer: Self-pay | Admitting: Neurology

## 2020-09-11 ENCOUNTER — Other Ambulatory Visit: Payer: Self-pay | Admitting: Neurology

## 2020-09-11 DIAGNOSIS — Z9989 Dependence on other enabling machines and devices: Secondary | ICD-10-CM

## 2020-09-11 NOTE — Telephone Encounter (Signed)
Called pt back. Current machine given to her 2019. Advised if she should like travel cpap machine, she would have to pay out of pocket for this since she would not be eligible for new machine via insurance since it is not 5 years or older. Pt ok w/ this. Wants to see what price would be first and then make decision from there.   She is also having continued issues w/ mask fitting. Advised we will send message to [employer] to reach out to her to discuss both of these things. I sent a community message to [employer].

## 2020-09-11 NOTE — Progress Notes (Signed)
CM sent to [employer] 

## 2020-09-11 NOTE — Telephone Encounter (Signed)
Pt called stating she is needing a new CPAP machine so she can travel with it and she also states she is needing some more supplies. Pt requesting a call back.

## 2021-11-30 ENCOUNTER — Encounter (INDEPENDENT_AMBULATORY_CARE_PROVIDER_SITE_OTHER): Payer: 59 | Admitting: Ophthalmology

## 2021-11-30 DIAGNOSIS — H353122 Nonexudative age-related macular degeneration, left eye, intermediate dry stage: Secondary | ICD-10-CM | POA: Diagnosis not present

## 2021-11-30 DIAGNOSIS — H43812 Vitreous degeneration, left eye: Secondary | ICD-10-CM | POA: Diagnosis not present

## 2021-11-30 DIAGNOSIS — H35371 Puckering of macula, right eye: Secondary | ICD-10-CM | POA: Diagnosis not present

## 2022-04-14 ENCOUNTER — Encounter: Payer: Self-pay | Admitting: Neurology

## 2022-04-14 ENCOUNTER — Ambulatory Visit (INDEPENDENT_AMBULATORY_CARE_PROVIDER_SITE_OTHER): Payer: 59 | Admitting: Neurology

## 2022-04-14 VITALS — BP 133/90 | HR 78 | Ht 64.0 in | Wt 174.4 lb

## 2022-04-14 DIAGNOSIS — G4733 Obstructive sleep apnea (adult) (pediatric): Secondary | ICD-10-CM

## 2022-04-14 NOTE — Progress Notes (Addendum)
SLEEP MEDICINE CLINIC    Provider:  Melvyn Novas, MD  Primary Care Physician:  Rodrigo Ran, MD [address] [place] [address]     Referring Provider: Rodrigo Ran, Md [address] [place],  [place] [address]          Chief Complaint according to patient   Patient presents with:     New Patient (Initial Visit)           HISTORY OF PRESENT ILLNESS:  GABBI PENDELL is a 67 y.o. Caucasian female sleep clinic patient who is seen here for a routine revisit on 04/14/2022 for follow up on OSA - treated with CPAP.  Chief concern according to patient :  "My CPAP is working well. It's 68 years old".   This is a routine visit for derek, biermann  meanwhile 67 year old Caucasian female patient with a history of obstructive sleep apnea treated with CPAP.  She was diagnosed in early 2019 and has been a compliant CPAP user since.  Her current compliance is 100% with an average use of time of 8 hours 43 minutes her set pressure is 8 cm of water with 3 cm EPR and her residual AHI is 1.2/h.  She does not have major air leakage.  She does not have central apneas arising.  Overall she has been very happy with her CPAP use and she has felt less fatigued and certainly less sleepy.  Her Epworth Sleepiness Scale today was endorsed at 5 points the fatigue severity at 26 points and she is, as I stated,a very compliant CPAP user. Her current medications are only Valtrex and Crestor.       09-12-2018, RV -  Complex sleep apnea, a 67 year old caucasian female patient , Mrs. Daphine Deutscher has been seen here in a regular routine revisit she does not have any medical urgencies or emergencies in the past 12 months.  As we know she is a former smoker but quit 40 years ago, she originally complained of snoring and was diagnosed with sleep apnea . Since her diagnosis she has been treated with CPAP.  She was followed by nurse practitioner Darrol Angel who has since retired.  The patient has been highly  compliant for the last 30 days - these data encompassed 30 days prior to 14 August 2018.  Average user time of 7 hours 33 minutes, set pressure is 8 cmH2O with 3 cm expiratory pressure relief and a residual AHI of only 1.9 per hour.  Very few central apneas emerging 95th percentile air leak is 16.9.  It seems that there has been more air leakage since the beginning of July which may have been related to not having supplies. She uses a nasal pillow.      8/14/2019CM Ms. Collet, 67 year old female returns for follow-up with newly diagnosed obstructive sleep apnea here for initial CPAP compliance.  She claims she has not had any problems getting adjusted to her machine she is much less fatigued.  Compliance data dated 08/05/2017-09/03/2017 shows compliance greater than 4 hours at 100%.  Average usage 8 hours 9 minutes.  Set pressure 8 cm EPR level 3.  Leak 95th percentile 16.4.  AHI 2.5.  ESS 4.  She returns for reevaluation 12/19/18CDJane P Diosdado is a 67 y.o. female , seen here  in a referral from Dr. Waynard Edwards for an evaluation of sleep apnea.   Mrs. Cotugno is a patient of Dr. Loraine Leriche [facility] who presents today on 12 January 2017 for a sleep evaluation.  The patient's husband has told her that she snores and he has witnessed her to have apneas at night.  She has tried Breathe Right strips without success, she has tried the ZEN tongue applicator, but often the device does not stay in place. Over the last 10 years she has gained weight, went through menopause, and has developed snoring. She has a cardiac murmur, decreased hearing.         Review of Systems: Out of a complete 14 system review, the patient complains of only the following symptoms, and all other reviewed systems are negative.:  Fatigue, sleepiness , snoring, fnocturia times one. No RLS How likely are you to doze in the following situations: 0 = not likely, 1 = slight chance, 2 = moderate chance, 3 = high chance   Sitting and Reading? Watching  Television? Sitting inactive in a public place (theater or meeting)? As a passenger in a car for an hour without a break? Lying down in the afternoon when circumstances permit? Sitting and talking to someone? Sitting quietly after lunch without alcohol? In a car, while stopped for a few minutes in traffic?   Total = 5/ 24 points   FSS endorsed at 26/ 63 points.   Social History   Socioeconomic History   Marital status: Married    Spouse name: Not on file   Number of children: Not on file   Years of education: Not on file   Highest education level: Not on file  Occupational History   Not on file  Tobacco Use   Smoking status: Former    Types: Cigarettes   Smokeless tobacco: Never   Tobacco comments:    quit smoking cigarretes 1980  Substance and Sexual Activity   Alcohol use: Yes    Alcohol/week: 14.0 standard drinks of alcohol    Types: 14 Glasses of wine per week    Comment: daily   Drug use: No   Sexual activity: Not on file  Other Topics Concern   Not on file  Social History Narrative   Not on file   Social Determinants of Health   Financial Resource Strain: Not on file  Food Insecurity: Not on file  Transportation Needs: Not on file  Physical Activity: Not on file  Stress: Not on file  Social Connections: Not on file    Family History  Problem Relation Age of Onset   Diabetes Mother    Heart failure Mother    Stroke Father    Hypertension Father    Leukemia Sister    Cancer Brother    Heart disease Other     Past Medical History:  Diagnosis Date   Medical history non-contributory     Past Surgical History:  Procedure Laterality Date   APPENDECTOMY     BREAST SURGERY     Hx: of augmentation   DILATION AND CURETTAGE OF UTERUS     LASER PHOTO ABLATION Right 04/10/2013   Procedure: LASER PHOTO ABLATION;  Surgeon: Sherrie George, MD;  [facility];  Service: Ophthalmology;  Laterality: Right;   MEMBRANE PEEL Right 04/10/2013   Procedure:  MEMBRANE PEEL;  Surgeon: Sherrie George, MD;  Location: [facility] OR;  Service: Ophthalmology;  Laterality: Right;   PARS PLANA VITRECTOMY Right 04/10/2013   Procedure: PARS PLANA VITRECTOMY WITH 25 GAUGE;  Surgeon: Sherrie George, MD;  Location: [facility] OR;  Service: Ophthalmology;  Laterality: Right;   TUBAL LIGATION  Current Outpatient Medications on File Prior to Visit  Medication Sig Dispense Refill   Cyanocobalamin 1000 MCG TBCR Take 1 tablet by mouth daily.     nystatin (MYCOSTATIN/NYSTOP) powder Apply 1 Application topically 2 (two) times daily.     rosuvastatin (CRESTOR) 20 MG tablet Take 20 mg by mouth daily.     valACYclovir (VALTREX) 500 MG tablet TAKE 1 TABLET TWICE A DAY FOR 3 DAYS AS NEEDED     No current facility-administered medications on file prior to visit.    No Known Allergies   DIAGNOSTIC DATA (LABS, IMAGING, TESTING) - I reviewed patient records, labs, notes, testing and imaging myself where available.  Lab Results  Component Value Date   WBC 8.3 04/10/2013   HGB 15.0 04/10/2013   HCT 43.3 04/10/2013   MCV 94.1 04/10/2013   PLT 238 04/10/2013      Component Value Date/Time   NA 142 04/10/2013 0858   K 4.0 04/10/2013 0858   CL 102 04/10/2013 0858   CO2 25 04/10/2013 0858   GLUCOSE 104 (H) 04/10/2013 0858   BUN 12 04/10/2013 0858   CREATININE 0.89 04/10/2013 0858   CALCIUM 9.4 04/10/2013 0858   PROT 7.1 04/10/2013 0858   ALBUMIN 4.1 04/10/2013 0858   AST 22 04/10/2013 0858   ALT 20 04/10/2013 0858   ALKPHOS 70 04/10/2013 0858   BILITOT 0.6 04/10/2013 0858   GFRNONAA 70 (L) 04/10/2013 0858   GFRAA 81 (L) 04/10/2013 0858   No results found for: "CHOL", "HDL", "LDLCALC", "LDLDIRECT", "TRIG", "CHOLHDL" No results found for: "HGBA1C" No results found for: "VITAMINB12" No results found for: "TSH"  PHYSICAL EXAM:  Today's Vitals   04/14/22 1319  BP: (!) 133/90  Pulse: 78  Weight: 174 lb 6.4 oz (79.1 kg)  Height: 5\' 4"  (1.626 m)   Body mass index  is 29.94 kg/m.   Wt Readings from Last 3 Encounters:  04/14/22 174 lb 6.4 oz (79.1 kg)  09/12/18 159 lb (72.1 kg)  09/07/17 170 lb 3.2 oz (77.2 kg)     Ht Readings from Last 3 Encounters:  04/14/22 5\' 4"  (1.626 m)  09/12/18 5' 4.5" (1.638 m)  09/07/17 5\' 4"  (1.626 m)      General: The patient is awake, alert and appears not in acute distress. The patient is well groomed. Head: Normocephalic, atraumatic. Neck is supple. Mallampati 2-3,  neck circumference:15 inches . Nasal airflow not fully  patent.  Cardiovascular:  Regular rate and cardiac rhythm by pulse,  without distended neck veins. Respiratory: Lungs are clear to auscultation.  Skin:  Without evidence of ankle edema, or rash. Trunk: The patient's posture is erect.   NEUROLOGIC EXAM: The patient is awake and alert, oriented to place and time.   Memory subjective described as intact.  Attention span & concentration ability appears normal.  Speech is fluent,  without  dysarthria, dysphonia or aphasia.  Mood and affect are appropriate.   Cranial nerves: no loss of smell or taste reported  Pupils are equal and briskly reactive to light. Funduscopic exam deferred.  Extraocular movements in vertical and horizontal planes were intact and without nystagmus. No Diplopia. Visual fields by finger perimetry are intact. Hearing was intact to soft voice and finger rubbing.    Facial sensation intact to fine touch.  Facial motor strength is symmetric and tongue and uvula move midline.  Neck ROM : rotation, tilt and flexion extension were normal for age and shoulder shrug was symmetrical.    Motor  exam:  Symmetric bulk, tone and ROM.   Normal tone without cog wheeling, symmetric grip strength .      ASSESSMENT AND PLAN 67 y.o. year old female  here with:    1) need for a new CPAP for OSA treatment, machine is 67 years old and set to 8 cm water.   2) HST was ordered.   Rv after 30 through 89 days on new CPAP.    I plan to  follow up either personally or through our NP within 2-3 months.   I would like to thank Rodrigo Ran, MD and Rodrigo Ran, Md [address] [place],  [place] [address] for allowing me to meet with and to take care of this pleasant patient.   CC: I will share my notes with PCP.  After spending a total time of  15  minutes face to face and additional time for physical and neurologic examination, review of laboratory studies,  personal review of imaging studies, reports and results of other testing and review of referral information / records as far as provided in visit,   Electronically signed by: Melvyn Novas, MD 04/14/2022 1:26 PM  [facility] Board certified by The [employer] of Sleep Medicine and Diplomate of the [employer] of Sleep Medicine. Board certified In Neurology through the ABPN, Fellow of the [employer] of Neu[employer]. Medical Director of [facility].

## 2022-04-14 NOTE — Patient Instructions (Signed)
Sleep Apnea Sleep apnea is a condition in which breathing pauses or becomes shallow during sleep. People with sleep apnea usually snore loudly. They may have times when they gasp and stop breathing for 10 seconds or more during sleep. This may happen many times during the night. Sleep apnea disrupts your sleep and keeps your body from getting the rest that it needs. This condition can increase your risk of certain health problems, including: Heart attack. Stroke. Obesity. Type 2 diabetes. Heart failure. Irregular heartbeat. High blood pressure. The goal of treatment is to help you breathe normally again. What are the causes?  The most common cause of sleep apnea is a collapsed or blocked airway. There are three kinds of sleep apnea: Obstructive sleep apnea. This kind is caused by a blocked or collapsed airway. Central sleep apnea. This kind happens when the part of the brain that controls breathing does not send the correct signals to the muscles that control breathing. Mixed sleep apnea. This is a combination of obstructive and central sleep apnea. What increases the risk? You are more likely to develop this condition if you: Are overweight. Smoke. Have a smaller than normal airway. Are older. Are female. Drink alcohol. Take sedatives or tranquilizers. Have a family history of sleep apnea. Have a tongue or tonsils that are larger than normal. What are the signs or symptoms? Symptoms of this condition include: Trouble staying asleep. Loud snoring. Morning headaches. Waking up gasping. Dry mouth or sore throat in the morning. Daytime sleepiness and tiredness. If you have daytime fatigue because of sleep apnea, you may be more likely to have: Trouble concentrating. Forgetfulness. Irritability or mood swings. Personality changes. Feelings of depression. Sexual dysfunction. This may include loss of interest if you are female, or erectile dysfunction if you are female. How is this  diagnosed? This condition may be diagnosed with: A medical history. A physical exam. A series of tests that are done while you are sleeping (sleep study). These tests are usually done in a sleep lab, but they may also be done at home. How is this treated? Treatment for this condition aims to restore normal breathing and to ease symptoms during sleep. It may involve managing health issues that can affect breathing, such as high blood pressure or obesity. Treatment may include: Sleeping on your side. Using a decongestant if you have nasal congestion. Avoiding the use of depressants, including alcohol, sedatives, and narcotics. Losing weight if you are overweight. Making changes to your diet. Quitting smoking. Using a device to open your airway while you sleep, such as: An oral appliance. This is a custom-made mouthpiece that shifts your lower jaw forward. A continuous positive airway pressure (CPAP) device. This device blows air through a mask when you breathe out (exhale). A nasal expiratory positive airway pressure (EPAP) device. This device has valves that you put into each nostril. A bi-level positive airway pressure (BIPAP) device. This device blows air through a mask when you breathe in (inhale) and breathe out (exhale). Having surgery if other treatments do not work. During surgery, excess tissue is removed to create a wider airway. Follow these instructions at home: Lifestyle Make any lifestyle changes that your health care provider recommends. Eat a healthy, well-balanced diet. Take steps to lose weight if you are overweight. Avoid using depressants, including alcohol, sedatives, and narcotics. Do not use any products that contain nicotine or tobacco. These products include cigarettes, chewing tobacco, and vaping devices, such as e-cigarettes. If you need help quitting, ask   your health care provider. General instructions Take over-the-counter and prescription medicines only as told  by your health care provider. If you were given a device to open your airway while you sleep, use it only as told by your health care provider. If you are having surgery, make sure to tell your health care provider you have sleep apnea. You may need to bring your device with you. Keep all follow-up visits. This is important. Contact a health care provider if: The device that you received to open your airway during sleep is uncomfortable or does not seem to be working. Your symptoms do not improve. Your symptoms get worse. Get help right away if: You develop: Chest pain. Shortness of breath. Discomfort in your back, arms, or stomach. You have: Trouble speaking. Weakness on one side of your body. Drooping in your face. These symptoms may represent a serious problem that is an emergency. Do not wait to see if the symptoms will go away. Get medical help right away. Call your local emergency services (911 in the [place] Do not drive yourself to the hospital. Summary Sleep apnea is a condition in which breathing pauses or becomes shallow during sleep. The most common cause is a collapsed or blocked airway. The goal of treatment is to restore normal breathing and to ease symptoms during sleep. This information is not intended to replace advice given to you by your health care provider. Make sure you discuss any questions you have with your health care provider. Document Revised: 08/20/2020 Document Reviewed: 12/21/2019 Elsevier Patient Education  2023 Elsevier Inc.  

## 2022-04-15 ENCOUNTER — Telehealth: Payer: Self-pay | Admitting: Neurology

## 2022-04-15 NOTE — Telephone Encounter (Signed)
HST- Medicare/BCBS supp no auth req'd (Pt lives in [place], altered p/u for pt,approved by [facility]) Pt will drop off on 4/25   Patient is scheduled at [facility] for 05/17/22 at 9 AM.  Mailed packet to the patient.

## 2022-05-17 ENCOUNTER — Ambulatory Visit (INDEPENDENT_AMBULATORY_CARE_PROVIDER_SITE_OTHER): Payer: Medicare Other | Admitting: Neurology

## 2022-05-17 DIAGNOSIS — G4733 Obstructive sleep apnea (adult) (pediatric): Secondary | ICD-10-CM

## 2022-05-17 DIAGNOSIS — H35371 Puckering of macula, right eye: Secondary | ICD-10-CM

## 2022-05-24 NOTE — Progress Notes (Signed)
[facility] Virginia Carr Female, 67 y.o., May 02, 1955 MRN: [id number]  HOME SLEEP TEST REPORT ( by Watch PAT)   STUDY DATA:  05-24-2022   ORDERING CLINICIAN:  Melvyn Novas, MD  REFERRING Rosalie GumsLoraine Leriche Perini,MD    04-14-2022: This is a routine visit for Virginia Carr, Virginia Carr  meanwhile 67 year old Caucasian female patient of Rodrigo Ran, MD, with a history of obstructive sleep apnea, treated since 2019 with CPAP.  She was diagnosed in early 2019 and has been a compliant CPAP user since.  Her current compliance is 100% with an average use of time of 8 hours 43 minutes her set pressure is 8 cm of water with 3 cm EPR and her residual AHI is 1.2/h.  She does not have major air leakage.  She does not have central apneas arising.  Overall she has been very happy with her CPAP use and she has felt less fatigued and certainly less sleepy.  Her Epworth Sleepiness Scale today was endorsed at 5 points the fatigue severity at 26 points and she is - as I stated- a very compliant CPAP user. Her current medications are only Valtrex and Crestor.  Epworth sleepiness score: 5 /24. FSS at 26/ 63 points .   BMI: 29.9 kg/m   Neck Circumference: 15"   FINDINGS:   Sleep Summary:   Total Recording Time (hours, min):   9 hours 5 minutes  Total Sleep Time (hours, min):   8 hours 28 minutes              Percent REM (%):   21.2%    Sleep latency was 16 minutes and REM sleep latency 92 minutes long.                                   Respiratory Indices by AASM criteria:   Calculated pAHI (per hour): 37.1/h     (22.1/h by CMS criteria)                        REM pAHI:   43.2/h      (25.3/h by CMS criteria)                                        NREM pAHI: 35.4/h    (21.3/h by CMS criteria)                      Positional AHI: The patient slept for nearly 200 minutes in supine position associated with an AHI of 29.8/h, followed by 130 minutes in left lateral position with an AHI of 56.7/h,  and by right lateral position with an AHI of 10/h over circa 120 minutes.  Snoring data show a mean volume of 43 dB, present for about 41% of total recorded sleep time.  This would have been a moderate to loud snoring.                                                 Oxygen Saturation Statistics:    O2 Saturation Range (%):    Between a nadir at 85% of the  maximal saturation at 97% with a mean saturation of 92%                                   O2 Saturation (minutes) <89%:   5.2 minutes  O2 saturation (minutes) <90%:   20.2 minutes       Pulse Rate Statistics:   Pulse Mean (bpm):   69 bpm              Pulse Range: Between 51 and 105 bpm.                 IMPRESSION:  This HST confirms the presence of severe obstructive sleep apnea by AASM criteria and moderate obstructive sleep apnea by CMS criteria - This type of apnea was not REM sleep dependent but REM sleep accentuated.  REM sleep was recorded in prone position, right and left lateral position and in supine.    Right lateral sleep position AHI was the lowest inspite of including REM sleep for over 40 minutes. The severity of the residual apnea still warrants continuation with CPAP therapy.    RECOMMENDATION: Continuation of positive airway pressure therapy is recommended by using an auto ResMed CPAP titration device including humidifier, interface of patient's choice, and a setting of 8 cmH2O with 3 cm EPR.    INTERPRETING PHYSICIAN:   Melvyn Novas, MD   [facility] is an AASM accredited sleep laboratory.

## 2022-05-30 NOTE — Addendum Note (Signed)
Addended by: Melvyn Novas on: 05/30/2022 02:37 PM   Modules accepted: Orders

## 2022-05-30 NOTE — Procedures (Signed)
[facility] Zara Council Female, 67 y.o., 09-Mar-1955 MRN: [id number]  HOME SLEEP TEST REPORT ( by Watch PAT)   STUDY DATA:  05-24-2022   ORDERING CLINICIAN:  Melvyn Novas, MD  REFERRING Rosalie GumsLoraine Leriche Perini,MD    04-14-2022: This is a routine visit for Virginia Carr, blonder  meanwhile 67 year old Caucasian female patient of Rodrigo Ran, MD, with a history of obstructive sleep apnea, treated since 2019 with CPAP.  She was diagnosed in early 2019 and has been a compliant CPAP user since.  Her current compliance is 100% with an average use of time of 8 hours 43 minutes her set pressure is 8 cm of water with 3 cm EPR and her residual AHI is 1.2/h.  She does not have major air leakage.  She does not have central apneas arising.  Overall she has been very happy with her CPAP use and she has felt less fatigued and certainly less sleepy.  Her Epworth Sleepiness Scale today was endorsed at 5 points the fatigue severity at 26 points and she is - as I stated- a very compliant CPAP user. Her current medications are only Valtrex and Crestor.  Epworth sleepiness score: 5 /24. FSS at 26/ 63 points .   BMI: 29.9 kg/m   Neck Circumference: 15"   FINDINGS:   Sleep Summary:   Total Recording Time (hours, min):   9 hours 5 minutes  Total Sleep Time (hours, min):   8 hours 28 minutes              Percent REM (%):   21.2%    Sleep latency was 16 minutes and REM sleep latency 92 minutes long.                                   Respiratory Indices by AASM criteria:   Calculated pAHI (per hour): 37.1/h     (22.1/h by CMS criteria)                        REM pAHI:   43.2/h      (25.3/h by CMS criteria)                                        NREM pAHI: 35.4/h    (21.3/h by CMS criteria)                      Positional AHI: The patient slept for nearly 200 minutes in supine position associated with an AHI of 29.8/h, followed by 130 minutes in left lateral position with an AHI of 56.7/h, and  by right lateral position with an AHI of 10/h over circa 120 minutes.  Snoring data show a mean volume of 43 dB, present for about 41% of total recorded sleep time.  This would have been a moderate to loud snoring.                                                 Oxygen Saturation Statistics:    O2 Saturation Range (%):    Between a nadir at 85% of the maximal saturation  at 97% with a mean saturation of 92%                                   O2 Saturation (minutes) <89%:   5.2 minutes  O2 saturation (minutes) <90%:   20.2 minutes       Pulse Rate Statistics:   Pulse Mean (bpm):   69 bpm              Pulse Range: Between 51 and 105 bpm.                 IMPRESSION:  This HST confirms the presence of severe obstructive sleep apnea by AASM criteria and moderate obstructive sleep apnea by CMS criteria - This type of apnea was not REM sleep dependent but REM sleep accentuated.  REM sleep was recorded in prone position, right and left lateral position and in supine.    Right lateral sleep position AHI was the lowest inspite of including REM sleep for over 40 minutes. The severity of the residual apnea still warrants continuation with CPAP therapy.    RECOMMENDATION: Continuation of positive airway pressure therapy is recommended by using an auto ResMed CPAP titration device including humidifier, interface of patient's choice, and a setting of 8 cmH2O with 3 cm EPR. The patient should prefer the right lateral sleep position. Weight loss and improvement of core strength can benefit most apnea patients leading to  reduction of snoring and AHI.     INTERPRETING PHYSICIAN:   Melvyn Novas, MD   [facility] is an AASM accredited sleep laboratory.

## 2022-05-31 ENCOUNTER — Telehealth: Payer: Self-pay | Admitting: Neurology

## 2022-05-31 NOTE — Telephone Encounter (Signed)
-----   Message from Melvyn Novas, MD sent at 05/30/2022  2:37 PM EDT ----- I sent this result  to the patient as well:  The HST confirmed apnea , of moderate-severe degree. The continuation of CPAP therapy is recommended and an order for CPAP device has been written. CD

## 2022-05-31 NOTE — Telephone Encounter (Signed)
Called the patient to discuss SSR. There was no answer. LVM asking for the patient to call back.  **When patient calls back, please advise the home sleep test was reviewed by Dr Vickey Huger and the patient still has sleep apnea. She recommends that she continues to use the CPAP for treatment. Pt should be eligible this year for a new CPAP 06/29/2022. I can forward the orders to [facility] for her to get set up with new machine. Once pt gets set up with the new cpap we need to have an initial CPAP visit that must take place 31-90 days from the date that she gets the new machine.  If pt ok with this plan,I will send the order for her and she should be scheduled 1st of August for an initial cpap visit.

## 2022-06-02 NOTE — Telephone Encounter (Signed)
Pt called back. I informed her of message nurse [facility] sent. Pt said she is okay with moving forward with this plan. Pt was schedule for an Initial CPAP appointment on 8/12 with Dr. Vickey Huger.

## 2022-06-02 NOTE — Telephone Encounter (Signed)
Orders sent to [facility] for the pt

## 2022-07-05 NOTE — Telephone Encounter (Signed)
Pt stated she called [facility]. Stated they don't have all completed paperwork. Stated she needs for nurse to call them.

## 2022-07-05 NOTE — Telephone Encounter (Signed)
Reaching out to [facility]

## 2022-07-06 NOTE — Telephone Encounter (Signed)
New, Daria Pastures, Abbe Amsterdam, CMA; Heywood Footman; Montez Morita, [facility],  This order is handled by a different region. I will have to email for details on what they are asking for. The order was received and was sent in for processing. I will get back with you as soon as I get some details.  Thank you,  Brad New       Previous Messages    ----- Message ----- From: Kathyrn Sheriff Sent: 07/05/2022   5:05 PM EDT To: Henderson Newcomer; Kathyrn Sheriff; Santina Evans; * Subject: RE: CPAP orders                                Received, Thank you!  ----- Message ----- From: Bobbye Morton, CMA Sent: 07/05/2022   3:41 PM EDT To: Henderson Newcomer; Kathyrn Sheriff; Santina Evans; * Subject: CPAP orders                                    Hey, this patient called in stating "she called [facility]. Stated they don't have all completed paperwork. Stated she needs for nurse to call them." Looks like casey sent the orders over on 06/02/22. Did you receive them? What else was needed?

## 2022-07-07 NOTE — Telephone Encounter (Signed)
New, Maryruth Bun, Abbe Amsterdam, CMA; Ellis Parents Eula Fried; Montez Morita, [facility],  Per notes it looks like order was received and is in process. it looks like it is missing a dated and signed diagnostic sleep study. I went into [facility] account and pulled all 3 copies and saved to the account for them to review and process.  Thank you,  Luellen Pucker

## 2022-07-28 ENCOUNTER — Encounter: Payer: Self-pay | Admitting: Neurology

## 2022-07-28 ENCOUNTER — Telehealth: Payer: Self-pay | Admitting: Neurology

## 2022-07-28 NOTE — Telephone Encounter (Signed)
LVM and sent letter in mail informing pt of need to reschedule 09/06/22 appt - MD out

## 2022-08-12 IMAGING — CT CT CARDIAC CORONARY ARTERY CALCIUM SCORE
3 series · 14 of 20 positions shown, 16 images · non-contrast
Comparison: Chest CT 04/10/2013

CLINICAL DATA: 64-year-old white female with history of high
cholesterol.

EXAM:
CT CARDIAC CORONARY ARTERY CALCIUM SCORE
TECHNIQUE: Non-contrast imaging through the heart was performed using
prospective ECG gating. Image post processing was performed on an
independent workstation, allowing for quantitative analysis of the
heart and coronary arteries. Note that this exam targets the heart
and the chest was not imaged in its entirety.

[Series 2: calcium scoring 2.00 qr36 bestdiast 68% hrt calciu · axial · 0.31mm/px · z∈[+1722,+1794]mm · 4 of 60 slices shown]
[im 12/60  vessel]
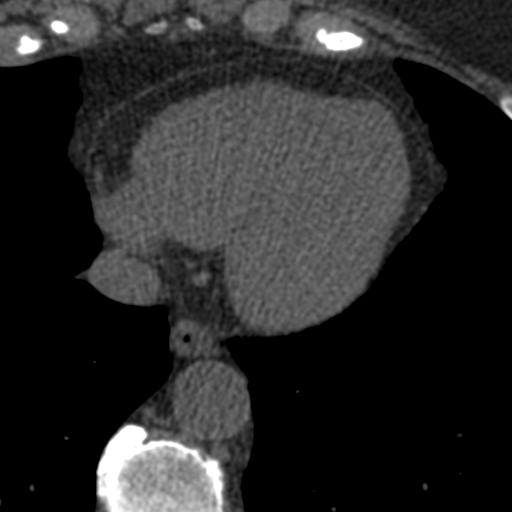
[im 24/60  vessel]
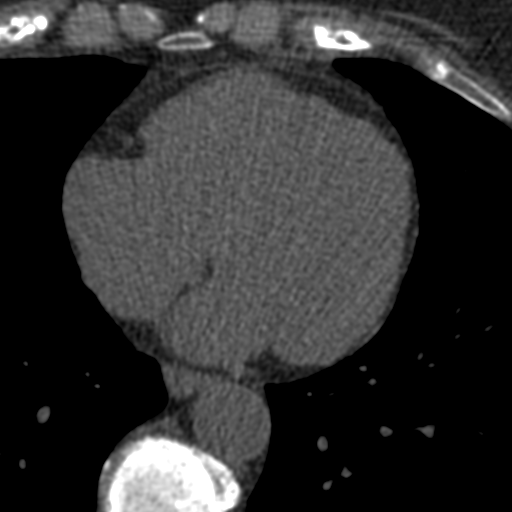
[im 36/60  vessel]
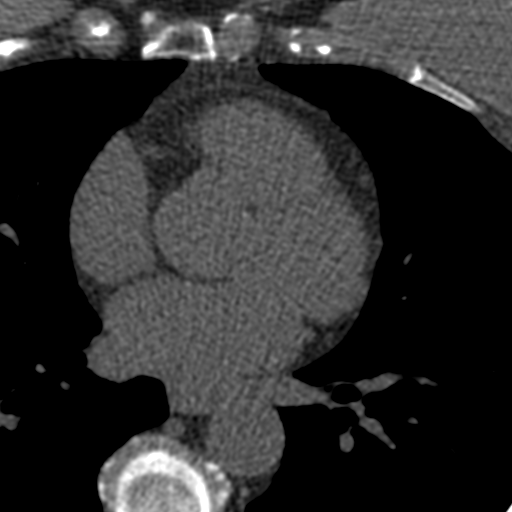
[im 48/60  vessel]
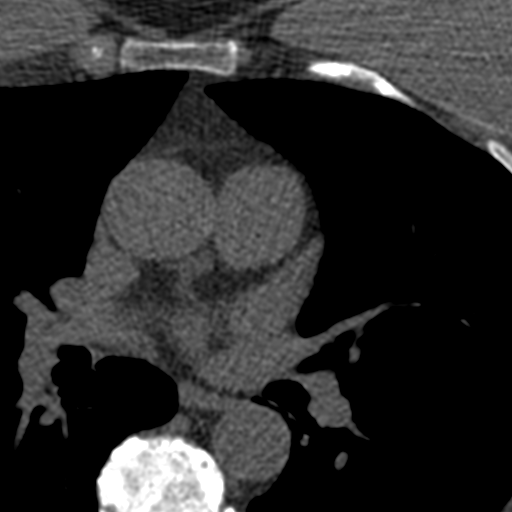

[Series 3: calcium scoring 2.00 br40 bestdiast 68% axial · axial · 0.52mm/px · z∈[+1718,+1798]mm · 5 of 60 slices shown, 7 images]
[im 10/60  vessel]
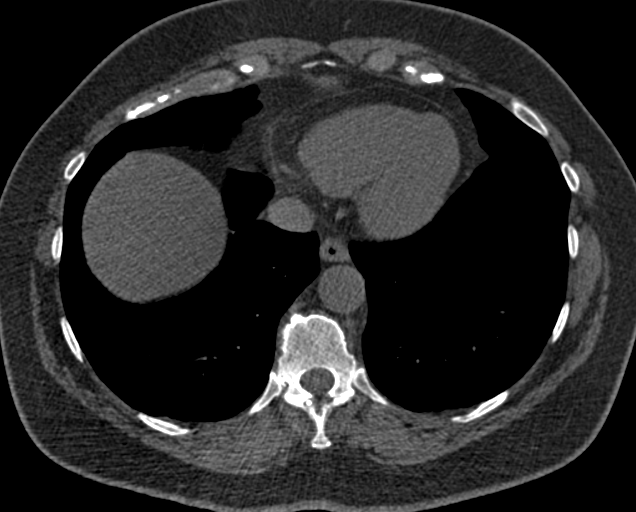
[im 10/60  lung]
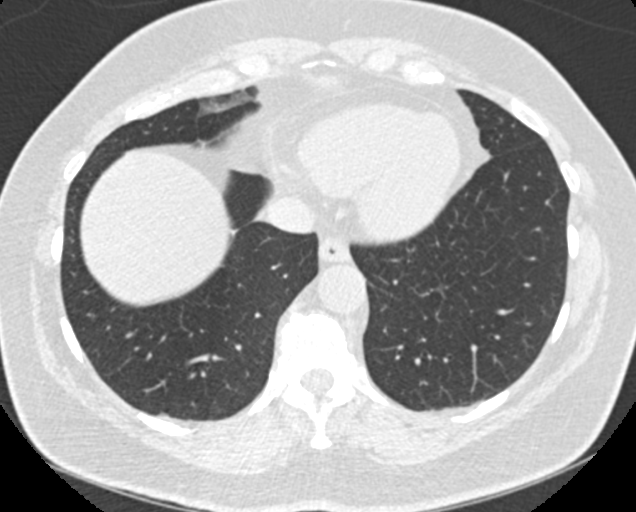
[im 20/60  vessel]
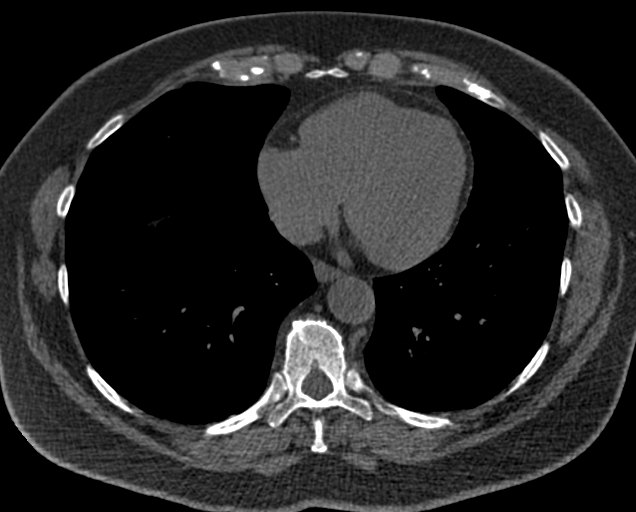
[im 30/60  vessel]
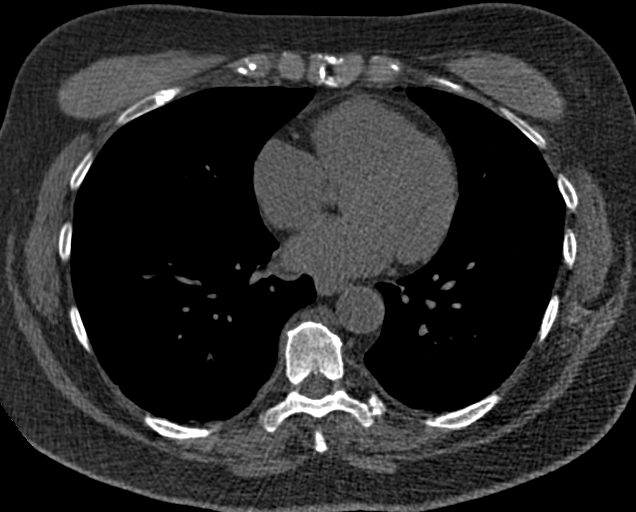
[im 40/60  vessel]
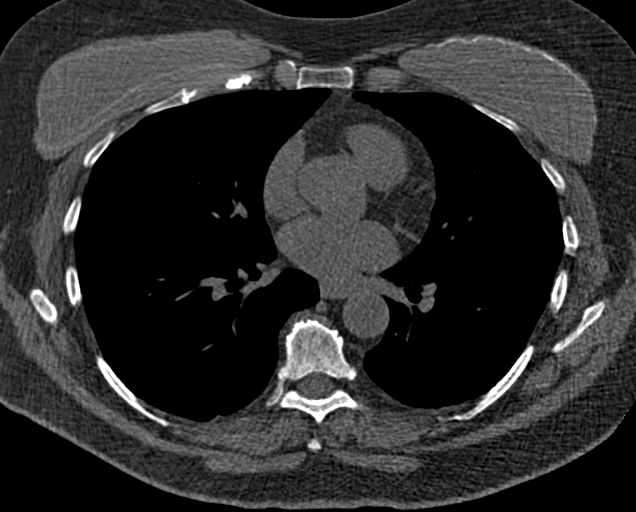
[im 50/60  vessel]
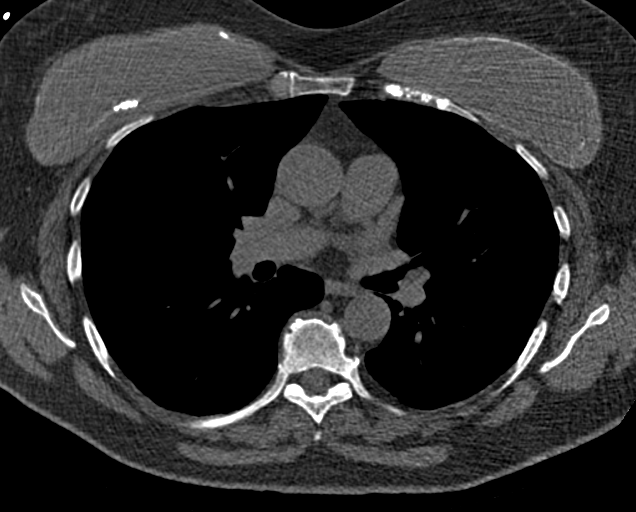
[im 50/60  lung]
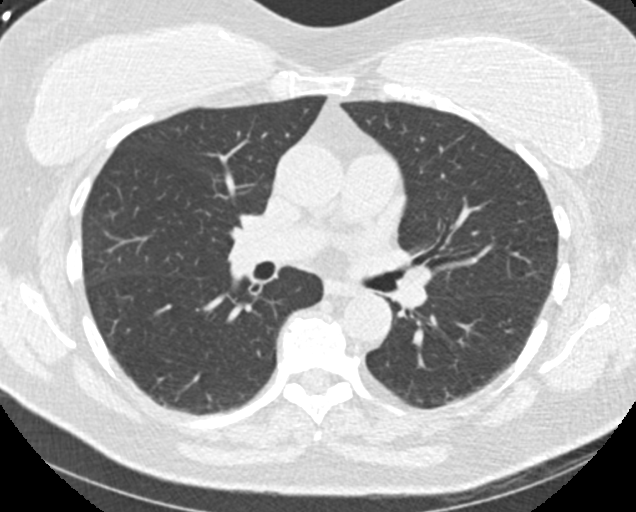

[Series 9: calcium scoring 2.00 br60 bestdiast 68% lungs · axial · 0.52mm/px · z∈[+1718,+1798]mm · 5 of 60 slices shown]
[im 10/60  vessel]
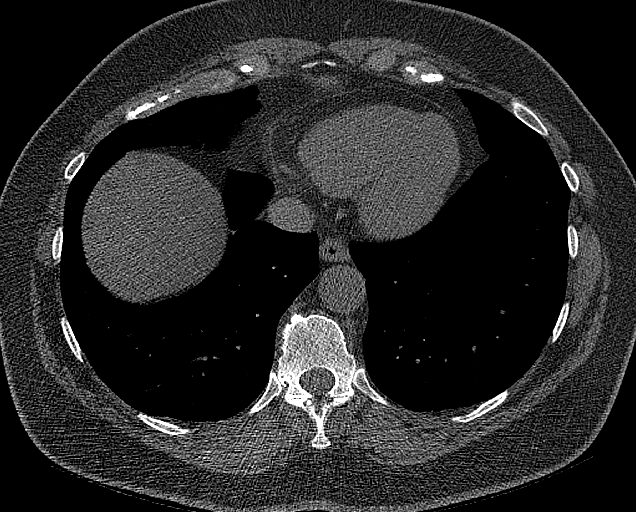
[im 20/60  vessel]
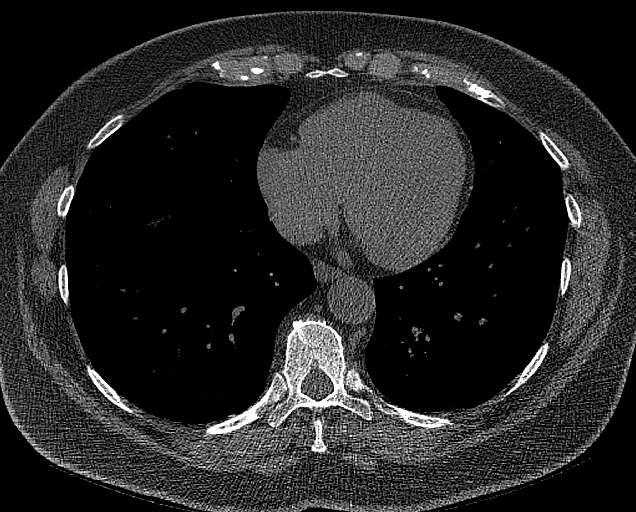
[im 30/60  vessel]
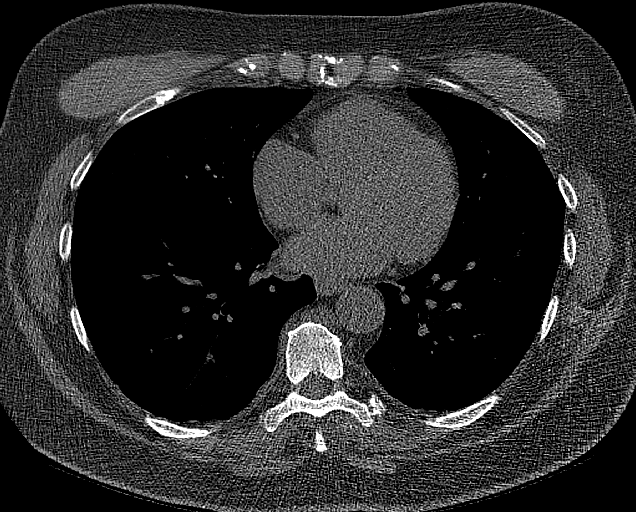
[im 40/60  vessel]
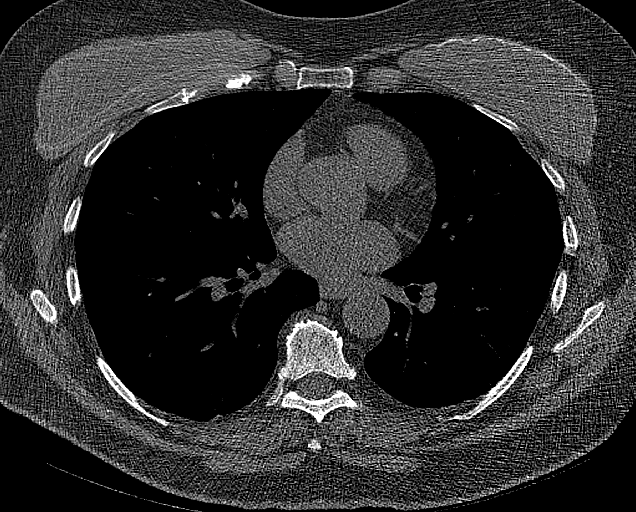
[im 50/60  vessel]
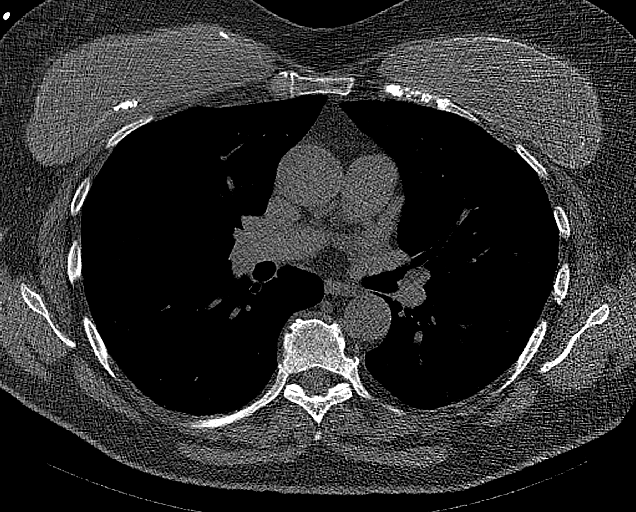

[14 of 20 positions shown; findings below may reference images not displayed]

FINDINGS: CORONARY CALCIUM SCORES:

Left Main:

LAD: 0

LCx: 0

RCA:

Total Agatston Score:

[HOSPITAL] percentile: 76

AORTA MEASUREMENTS:

Ascending Aorta: 34 mm

Descending Aorta: 26 mm

OTHER FINDINGS:

Large focus of calcium at the origin the left main coronary artery.
Difficult to differentiate aortic calcifications from coronary
artery calcifications at the left main origin. No other significant
calcium in the visualized thoracic aorta. Heart size is normal. No
significant pericardial fluid. The visualized mediastinal structures
are unremarkable. Patient has bilateral breast implants. Images of
the upper abdomen are unremarkable. 3 mm nodule in the lingula on
sequence 9, image 22 and this is stable since 5889. This is
compatible with a benign nodule. Dependent densities in both lower
lobes likely represent atelectasis and possibly mild scarring. No
significant pleural fluid. Degenerative endplate changes in the
thoracic spine.
IMPRESSION: Coronary calcium score is 59.7 and this is at percentile 76 for
patients of the same age, gender and ethnicity. Coronary calcium
score may be an over-estimation because calcium appears to be
involving the aorta and the origin of the main left coronary artery.
However, the patient may benefit from a cardiology consult and
dedicated coronary artery CTA since majority of the calcium is at
the left main coronary artery origin.

## 2022-09-06 ENCOUNTER — Ambulatory Visit: Payer: Medicare Other | Admitting: Neurology

## 2022-09-09 NOTE — Progress Notes (Signed)
[facility] [address] [place]. [place] [address] [phone]       OFFICE FOLLOW UP NOTE  [place] Date of Birth:  August 23, 1955 Medical Record Number:  [id number]    Neurologist: Dr. Vickey Huger Reason for visit: CPAP follow-up    SUBJECTIVE:   CHIEF COMPLAINT:  Chief Complaint  Patient presents with   Follow-up    Patient in room #8 and alone. Patient states she sleeping good but her new CPAP machine use a lot of water and drys out.   Follow-up visit:  Prior visit: 04/14/2022 with Dr. Vickey Huger  Brief HPI:   [place] is a 67 y.o. female who has been a patient of Dr. Vickey Huger since 2018, diagnosed with sleep apnea and started on CPAP in 2019.  Has noted to be compliant with CPAP use since.   At prior visit, due for new machine as current machine 68 years old.  Repeat HST 04/2022 confirmed moderate to severe sleep apnea.  Received new CPAP 07/12/2022.   Interval history:  Compliance report shows excellent usage and optimal residual AHI.  Using nasal pillow mask, the same mask she was using prior. She does report since receiving new machine, water chamber will run out of water while using, will have access water in tubing, machine making noise/leaks and having dry mouth. She did make adjustments to her humidity level but no significant benefit. Reports past 2 nights she was staying in a hotel room (as she live in [place]), she decreased humidity level and ended up placing machine on the floor, did not run out of water or have water in tubing, and no issues with leaks or dry mouth.  Routinely follows with [employer] [facility].  ESS 5/24.        ROS:   14 system review of systems performed and negative with exception of those listed in HPI  PMH:  Past Medical History:  Diagnosis Date   Medical history non-contributory     PSH:  Past Surgical History:  Procedure Laterality Date   APPENDECTOMY     BREAST SURGERY     Hx: of augmentation    DILATION AND CURETTAGE OF UTERUS     LASER PHOTO ABLATION Right 04/10/2013   Procedure: LASER PHOTO ABLATION;  Surgeon: Sherrie George, MD;  Location: [facility] OR;  Service: Ophthalmology;  Laterality: Right;   MEMBRANE PEEL Right 04/10/2013   Procedure: MEMBRANE PEEL;  Surgeon: Sherrie George, MD;  Location: [facility] OR;  Service: Ophthalmology;  Laterality: Right;   PARS PLANA VITRECTOMY Right 04/10/2013   Procedure: PARS PLANA VITRECTOMY WITH 25 GAUGE;  Surgeon: Sherrie George, MD;  Location: [facility] OR;  Service: Ophthalmology;  Laterality: Right;   TUBAL LIGATION      Social History:  Social History   Socioeconomic History   Marital status: Married    Spouse name: Not on file   Number of children: Not on file   Years of education: Not on file   Highest education level: Not on file  Occupational History   Not on file  Tobacco Use   Smoking status: Former    Types: Cigarettes   Smokeless tobacco: Never   Tobacco comments:    quit smoking cigarretes 1980  Substance and Sexual Activity   Alcohol use: Yes    Alcohol/week: 14.0 standard drinks of alcohol    Types: 14 Glasses of wine per week    Comment: daily   Drug use: No   Sexual activity:  Not on file  Other Topics Concern   Not on file  Social History Narrative   Not on file   Social Determinants of Health   Financial Resource Strain: Not on file  Food Insecurity: Not on file  Transportation Needs: Not on file  Physical Activity: Not on file  Stress: Not on file  Social Connections: Unknown (06/09/2021)   Received from [facility]   Social Network    Social Network: Not on file  Intimate Partner Violence: Unknown (05/01/2021)   Received from [facility]   HITS    Physically Hurt: Not on file    Insult or Talk Down To: Not on file    Threaten Physical Harm: Not on file    Scream or Curse: Not on file    Family History:  Family History  Problem Relation Age of Onset   Diabetes Mother    Heart failure Mother    Stroke  Father    Hypertension Father    Leukemia Sister    Cancer Brother    Heart disease Other     Medications:   Current Outpatient Medications on File Prior to Visit  Medication Sig Dispense Refill   Cyanocobalamin 1000 MCG TBCR Take 1 tablet by mouth daily.     nystatin (MYCOSTATIN/NYSTOP) powder Apply 1 Application topically 2 (two) times daily.     rosuvastatin (CRESTOR) 20 MG tablet Take 20 mg by mouth daily.     valACYclovir (VALTREX) 500 MG tablet TAKE 1 TABLET TWICE A DAY FOR 3 DAYS AS NEEDED     No current facility-administered medications on file prior to visit.    Allergies:  No Known Allergies    OBJECTIVE:  Physical Exam  Vitals:   09/13/22 0952  BP: 126/68  Pulse: 64  Weight: 170 lb (77.1 kg)  Height: 5\' 4"  (1.626 m)   Body mass index is 29.18 kg/m. No results found.   General: well developed, well nourished, very pleasant Caucasian female, seated, in no evident distress Head: head normocephalic and atraumatic.   Neck: supple with no carotid or supraclavicular bruits Cardiovascular: regular rate and rhythm, no murmurs Musculoskeletal: no deformity Skin:  no rash/petichiae Vascular:  Normal pulses all extremities   Neurologic Exam Mental Status: Awake and fully alert. Oriented to place and time. Recent and remote memory intact. Attention span, concentration and fund of knowledge appropriate. Mood and affect appropriate.  Cranial Nerves: Pupils equal, briskly reactive to light. Extraocular movements full without nystagmus. Visual fields full to confrontation. Hearing intact. Facial sensation intact. Face, tongue, palate moves normally and symmetrically.  Motor: Normal bulk and tone. Normal strength in all tested extremity muscles Sensory.: intact to touch , pinprick , position and vibratory sensation.  Coordination: Rapid alternating movements normal in all extremities. Finger-to-nose and heel-to-shin performed accurately bilaterally. Gait and Station:  Arises from chair without difficulty. Stance is normal. Gait demonstrates normal stride length and balance without use of AD. Reflexes: 1+ and symmetric. Toes downgoing.         ASSESSMENT/PLAN: [place] is a 67 y.o. year old female    OSA on CPAP : Compliance report shows satisfactory usage with optimal residual AHI.  Continue current pressure settings.  Discussed concerns regarding new machine (running out of water, excess water in tubing, leaks, dry mouth), recently improved after making adjustments to humidification level and placing machine on floor but has been in a hotel room the past 2 nights, advised her to monitor humidification levels and make  adjustments as needed especially once she returns home, if issues persist once she returns home to contact DME company for further recommendations.  Discussed continued nightly usage with ensuring greater than 4 hours nightly for optimal benefit and per insurance purposes.  Continue to follow with DME company for any needed supplies or CPAP related concerns     Follow up in 1 year via MyChart VV or call earlier if needed   CC:  PCP: Rodrigo Ran, MD    I spent 25 minutes of face-to-face and non-face-to-face time with patient.  This included previsit chart review, lab review, study review, order entry, electronic health record documentation, patient education regarding diagnosis of sleep apnea with review and discussion of compliance report and answered all other questions to patient's satisfaction   Ihor Austin, [facility]  [facility] [address] [place], [place] [address]  Phone [phone] Fax [phone] Note: This document was prepared with digital dictation and possible smart phrase technology. Any transcriptional errors that result from this process are unintentional.

## 2022-09-13 ENCOUNTER — Ambulatory Visit (INDEPENDENT_AMBULATORY_CARE_PROVIDER_SITE_OTHER): Payer: Medicare Other | Admitting: Adult Health

## 2022-09-13 ENCOUNTER — Encounter: Payer: Self-pay | Admitting: Adult Health

## 2022-09-13 VITALS — BP 126/68 | HR 64 | Ht 64.0 in | Wt 170.0 lb

## 2022-09-13 DIAGNOSIS — G4733 Obstructive sleep apnea (adult) (pediatric): Secondary | ICD-10-CM | POA: Diagnosis not present

## 2022-09-13 NOTE — Patient Instructions (Addendum)
Your Plan:  Continue nightly use of CPAP for sleep apnea management   Continue to follow with [employer] [facility] for any needed supplies or CPAP related concerns      Follow up in 1 year or call earlier if needed       Thank you for coming to see [place] at [facility]. I hope we have been able to provide you high quality care today.  You may receive a patient satisfaction survey over the next few weeks. We would appreciate your feedback and comments so that we may continue to improve ourselves and the health of our patients.

## 2023-08-18 ENCOUNTER — Telehealth: Payer: Self-pay | Admitting: Adult Health

## 2023-08-18 NOTE — Telephone Encounter (Signed)
 request to cancel appointment

## 2023-08-19 NOTE — Telephone Encounter (Signed)
 Pt has r/s due to a conflict

## 2023-08-25 ENCOUNTER — Telehealth: Payer: Medicare Other | Admitting: Adult Health

## 2023-09-15 ENCOUNTER — Telehealth: Payer: Medicare Other | Admitting: Adult Health

## 2023-10-04 ENCOUNTER — Telehealth: Payer: Self-pay | Admitting: Neurology

## 2023-10-04 ENCOUNTER — Telehealth: Payer: Self-pay | Admitting: Adult Health

## 2023-10-04 NOTE — Telephone Encounter (Signed)
 Pt called to reschedule appt due too provider out . Pt is requesting to be seen 9-15 or 9-16 . [place] in [place]  and is requesting to be seen then due to the travel .

## 2023-10-04 NOTE — Telephone Encounter (Signed)
 LVM and sent mychart msg informing pt of need to reschedule 10/05/23 appt - NP out

## 2023-10-05 ENCOUNTER — Ambulatory Visit: Admitting: Adult Health

## 2023-10-10 ENCOUNTER — Telehealth: Payer: Self-pay | Admitting: Neurology

## 2023-10-10 NOTE — Progress Notes (Unsigned)
 [place]

## 2023-10-10 NOTE — Telephone Encounter (Signed)
 LVM and sent mychart msg informing pt of need to reschedule 10/11/23 appt - MD out

## 2023-10-11 ENCOUNTER — Ambulatory Visit: Admitting: Neurology

## 2023-10-11 ENCOUNTER — Ambulatory Visit (INDEPENDENT_AMBULATORY_CARE_PROVIDER_SITE_OTHER): Admitting: Adult Health

## 2023-10-11 ENCOUNTER — Encounter: Payer: Self-pay | Admitting: Adult Health

## 2023-10-11 VITALS — BP 146/81 | HR 61 | Ht 64.0 in | Wt 171.0 lb

## 2023-10-11 DIAGNOSIS — G4733 Obstructive sleep apnea (adult) (pediatric): Secondary | ICD-10-CM

## 2023-10-18 ENCOUNTER — Ambulatory Visit: Admitting: Adult Health

## 2024-01-09 ENCOUNTER — Ambulatory Visit: Admitting: Adult Health

## 2024-10-09 ENCOUNTER — Ambulatory Visit: Admitting: Adult Health
